# Patient Record
Sex: Female | Born: 1965 | Race: White | Hispanic: No | Smoking: Never smoker
Health system: Southern US, Community
[De-identification: ages and names within clinical notes are randomized; demographics above are authoritative.]

## PROBLEM LIST (undated history)

## (undated) DIAGNOSIS — F419 Anxiety disorder, unspecified: Secondary | ICD-10-CM

## (undated) DIAGNOSIS — T7840XA Allergy, unspecified, initial encounter: Secondary | ICD-10-CM

## (undated) DIAGNOSIS — M1990 Unspecified osteoarthritis, unspecified site: Secondary | ICD-10-CM

## (undated) DIAGNOSIS — F341 Dysthymic disorder: Secondary | ICD-10-CM

## (undated) DIAGNOSIS — F3189 Other bipolar disorder: Secondary | ICD-10-CM

## (undated) DIAGNOSIS — M129 Arthropathy, unspecified: Secondary | ICD-10-CM

## (undated) HISTORY — DX: Arthropathy, unspecified: M12.9

## (undated) HISTORY — PX: TUBAL LIGATION: SHX77

## (undated) HISTORY — DX: Other bipolar disorder: F31.89

## (undated) HISTORY — DX: Dysthymic disorder: F34.1

## (undated) HISTORY — DX: Anxiety disorder, unspecified: F41.9

## (undated) HISTORY — DX: Allergy, unspecified, initial encounter: T78.40XA

## (undated) HISTORY — DX: Unspecified osteoarthritis, unspecified site: M19.90

---

## 1998-10-09 ENCOUNTER — Other Ambulatory Visit: Admission: RE | Admit: 1998-10-09 | Discharge: 1998-10-09 | Payer: Self-pay | Admitting: Obstetrics and Gynecology

## 1999-12-31 ENCOUNTER — Other Ambulatory Visit: Admission: RE | Admit: 1999-12-31 | Discharge: 1999-12-31 | Payer: Self-pay | Admitting: Obstetrics and Gynecology

## 2000-07-16 ENCOUNTER — Inpatient Hospital Stay (HOSPITAL_COMMUNITY): Admission: AD | Admit: 2000-07-16 | Discharge: 2000-07-16 | Payer: Self-pay | Admitting: Obstetrics and Gynecology

## 2000-08-22 ENCOUNTER — Observation Stay (HOSPITAL_COMMUNITY): Admission: AD | Admit: 2000-08-22 | Discharge: 2000-08-23 | Payer: Self-pay | Admitting: Obstetrics and Gynecology

## 2000-09-02 ENCOUNTER — Encounter (INDEPENDENT_AMBULATORY_CARE_PROVIDER_SITE_OTHER): Payer: Self-pay

## 2000-09-02 ENCOUNTER — Inpatient Hospital Stay (HOSPITAL_COMMUNITY): Admission: AD | Admit: 2000-09-02 | Discharge: 2000-09-05 | Payer: Self-pay | Admitting: Obstetrics and Gynecology

## 2001-01-12 ENCOUNTER — Other Ambulatory Visit: Admission: RE | Admit: 2001-01-12 | Discharge: 2001-01-12 | Payer: Self-pay | Admitting: Obstetrics and Gynecology

## 2004-09-17 ENCOUNTER — Ambulatory Visit: Payer: Self-pay | Admitting: Family Medicine

## 2005-02-20 ENCOUNTER — Ambulatory Visit: Payer: Self-pay | Admitting: Family Medicine

## 2005-08-29 ENCOUNTER — Ambulatory Visit: Payer: Self-pay | Admitting: Internal Medicine

## 2006-09-08 ENCOUNTER — Ambulatory Visit: Payer: Self-pay | Admitting: Family Medicine

## 2007-03-10 ENCOUNTER — Telehealth: Payer: Self-pay | Admitting: Family Medicine

## 2007-05-07 ENCOUNTER — Telehealth: Payer: Self-pay

## 2007-05-28 ENCOUNTER — Ambulatory Visit: Payer: Self-pay | Admitting: Family Medicine

## 2007-06-17 ENCOUNTER — Telehealth: Payer: Self-pay | Admitting: Family Medicine

## 2007-08-27 ENCOUNTER — Ambulatory Visit: Payer: Self-pay | Admitting: Family Medicine

## 2007-08-27 LAB — CONVERTED CEMR LAB: Rapid Strep: NEGATIVE

## 2007-12-03 ENCOUNTER — Telehealth: Payer: Self-pay | Admitting: Family Medicine

## 2008-01-12 ENCOUNTER — Telehealth: Payer: Self-pay | Admitting: Family Medicine

## 2008-05-24 ENCOUNTER — Telehealth: Payer: Self-pay | Admitting: Family Medicine

## 2008-05-30 ENCOUNTER — Ambulatory Visit: Payer: Self-pay | Admitting: Family Medicine

## 2008-05-30 DIAGNOSIS — F341 Dysthymic disorder: Secondary | ICD-10-CM

## 2008-12-28 ENCOUNTER — Telehealth: Payer: Self-pay | Admitting: Family Medicine

## 2009-01-02 ENCOUNTER — Ambulatory Visit: Payer: Self-pay | Admitting: Family Medicine

## 2009-01-02 LAB — CONVERTED CEMR LAB
Nitrite: NEGATIVE
Urobilinogen, UA: 0.2
WBC Urine, dipstick: NEGATIVE

## 2009-01-03 ENCOUNTER — Encounter: Payer: Self-pay | Admitting: Family Medicine

## 2009-01-17 LAB — CONVERTED CEMR LAB
ALT: 18 units/L (ref 0–35)
Albumin: 3.9 g/dL (ref 3.5–5.2)
Basophils Relative: 0.8 % (ref 0.0–3.0)
CO2: 28 meq/L (ref 19–32)
Chloride: 107 meq/L (ref 96–112)
Cholesterol: 141 mg/dL (ref 0–200)
Direct LDL: 76.7 mg/dL
Eosinophils Absolute: 0.1 10*3/uL (ref 0.0–0.7)
Eosinophils Relative: 1.8 % (ref 0.0–5.0)
HCT: 35.3 % — ABNORMAL LOW (ref 36.0–46.0)
Hemoglobin: 12.7 g/dL (ref 12.0–15.0)
MCHC: 36 g/dL (ref 30.0–36.0)
MCV: 84.5 fL (ref 78.0–100.0)
Monocytes Absolute: 0.6 10*3/uL (ref 0.1–1.0)
Neutro Abs: 4.5 10*3/uL (ref 1.4–7.7)
Potassium: 4 meq/L (ref 3.5–5.1)
RBC: 4.17 M/uL (ref 3.87–5.11)
Sodium: 139 meq/L (ref 135–145)
Total CHOL/HDL Ratio: 4
Total Protein: 7.2 g/dL (ref 6.0–8.3)
Vit D, 25-Hydroxy: 27 ng/mL — ABNORMAL LOW (ref 30–89)
WBC: 7.8 10*3/uL (ref 4.5–10.5)

## 2009-11-15 ENCOUNTER — Telehealth: Payer: Self-pay | Admitting: Family Medicine

## 2010-01-10 ENCOUNTER — Ambulatory Visit: Payer: Self-pay | Admitting: Family Medicine

## 2010-01-10 DIAGNOSIS — F3189 Other bipolar disorder: Secondary | ICD-10-CM

## 2010-01-10 DIAGNOSIS — M1710 Unilateral primary osteoarthritis, unspecified knee: Secondary | ICD-10-CM

## 2010-01-10 DIAGNOSIS — M179 Osteoarthritis of knee, unspecified: Secondary | ICD-10-CM | POA: Insufficient documentation

## 2010-01-14 ENCOUNTER — Telehealth: Payer: Self-pay | Admitting: Family Medicine

## 2010-09-10 NOTE — Progress Notes (Signed)
Summary: Alprazolam refill request  Phone Note Call from Patient Call back at [phone]   Caller: Patient Call For: Judithann Sheen MD Summary of Call: VM from pt requesting refil of Alprazolam Initial call taken by: Sid Falcon LPN,  January 15, 5175 10:59 AM  Follow-up for Phone Call        called and left mess for pt to return call as she was seen last thurday and rx given to pt along with rx for diclofencc Follow-up by: Pura Spice, RN,  January 15, 2010 8:03 AM  Additional Follow-up for Phone Call Additional follow up Details #1::        pt called has rx alprazolam and had it filled   Additional Follow-up by: Pura Spice, RN,  January 15, 2010 10:18 AM

## 2010-09-10 NOTE — Progress Notes (Signed)
Summary: refill alprazolam needs ov   Phone Note From Pharmacy   [facility]: [facility]  [address] [place] [phone]* Reason for Call: Needs renewal Summary of Call: refill alprazolam  Initial call taken by: Pura Spice, RN,  November 15, 2009 1:19 PM  Follow-up for Phone Call        ok x 1  Follow-up by: Pura Spice, RN,  November 15, 2009 1:19 PM    New/Updated Medications: ALPRAZOLAM 0.5 MG  TABS (ALPRAZOLAM) three times a day  needs ov Prescriptions: ALPRAZOLAM 0.5 MG  TABS (ALPRAZOLAM) three times a day  needs ov  #90 x 0   Entered by:   Pura Spice, RN   Authorized by:   Judithann Sheen MD   Signed by:   Pura Spice, RN on 11/15/2009   Method used:   Telephoned to ...       [facility]  [address] [place] [phone]* (retail)       [phone]       [place], [place]  [address]       Ph: [phone] or [phone]       Fax: [phone]   RxID:   [id number]

## 2010-09-10 NOTE — Assessment & Plan Note (Signed)
Summary: REFILL MEDS/CB   Vital Signs:  Patient profile:   45 year old female Weight:      175 pounds BMI:     28.78 O2 Sat:      94 % Temp:     98.9 degrees F Pulse rate:   88 / minute BP sitting:   106 / 70  (left arm)  Vitals Entered By: Pura Spice, RN (January 10, 2010 4:29 PM) CC: reill alprazolam    History of Present Illness: This 45 year old white female is in to go over medications and refill her alprazolam. We discussed last visit for her to see a psychiatrist which he did and the diagnosis was made of bipolar disease. TSE and Dr. Tomasa Rand a psychiatrist, and sees a third cyst every 2 weeks by the name of carcinoma S[place] she is on Depakote and neck roll at bedtime she discussed with this contrast the fact that she had been taken alprazolam in May he agrees that she can continue this as needed Her only other complaints are that of joint discomfort of the knees with some swelling pain after sitting and began to walk and also to getting out of bed in the morning it persists but not quite as bad and she desires treatment  Allergies (verified): No Known Drug Allergies  Review of Systems      See HPI  The patient denies anorexia, fever, weight loss, weight gain, vision loss, decreased hearing, hoarseness, chest pain, syncope, dyspnea on exertion, peripheral edema, prolonged cough, headaches, hemoptysis, abdominal pain, melena, hematochezia, severe indigestion/heartburn, hematuria, incontinence, genital sores, muscle weakness, suspicious skin lesions, transient blindness, difficulty walking, depression, unusual weight change, abnormal bleeding, enlarged lymph nodes, angioedema, breast masses, and testicular masses.    Physical Exam  General:  Well-developed,well-nourished,in no acute distress; alert,appropriate and cooperative throughout examination Lungs:  Normal respiratory effort, chest expands symmetrically. Lungs are clear to auscultation, no crackles or wheezes. Heart:   Normal rate and regular rhythm. S1 and S2 normal without gallop, murmur, click, rub or other extra sounds. Msk:  tenderness medially and laterally aspect of both knees no swelling Extremities:  No clubbing, cyanosis, edema, or deformity noted with normal full range of motion of all joints.     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  Problem # 2:  BIPOLAR II DISORDER (ICD-296.89) 1500 mg Depakote lamictal hs  Complete Medication List: 1)  Alprazolam 0.5 Mg Tabs (Alprazolam) .... Three times a day 2)  Diclofenac Sodium 75 Mg Tbec (Diclofenac sodium) .Marland Kitchen.. 1 two times a day by mouth for arthritis 3)  Depakote 500 Mg Tbec (Divalproex sodium) .... 3 h.s. 4)  Lamictil  .... One h.s.  Patient Instructions: 1)  continue under the care of the psychiatrist and therapis continued 2)  Depakote in the medical 3)  Since his psychiatrist is agreeable continue alprazolam as needed 4)  With the new diagnosis of arthritis of the knees we will start  5)  Diclofenac 75 mg twice daily after meals 6)  Return in 6 months or earlier if needed Prescriptions: DICLOFENAC SODIUM 75 MG TBEC (DICLOFENAC SODIUM) 1 two times a day by mouth for arthritis  #60 x 11   Entered and Authorized by:   Judithann Sheen MD   Signed by:   Judithann Sheen MD on 01/10/2010   Method used:   [facility]  [facility]  [address] [place] [phone]* (retail)       [phone] [address]       [place], [place]  [address]       Ph: [phone] or [phone]       Fax: [phone]   RxID:   [id number] ALPRAZOLAM 0.5 MG  TABS (ALPRAZOLAM) three times a day  #90 x 5   Entered by:   Pura Spice, RN   Authorized by:   Judithann Sheen MD   Signed by:   Pura Spice, RN on 01/10/2010   Method used:   Print then Give to Patient   RxID:   [id number]

## 2010-09-11 ENCOUNTER — Encounter: Payer: Self-pay | Admitting: Internal Medicine

## 2010-09-11 ENCOUNTER — Ambulatory Visit (INDEPENDENT_AMBULATORY_CARE_PROVIDER_SITE_OTHER): Payer: BC Managed Care – PPO | Admitting: Internal Medicine

## 2010-09-11 ENCOUNTER — Encounter: Payer: Self-pay | Admitting: Family Medicine

## 2010-09-11 VITALS — BP 120/80 | HR 78 | Temp 98.6°F | Wt 186.0 lb

## 2010-09-11 DIAGNOSIS — B029 Zoster without complications: Secondary | ICD-10-CM

## 2010-09-11 MED ORDER — VALACYCLOVIR HCL 1 G PO TABS: 1000.0000 mg | ORAL_TABLET | Freq: Three times a day (TID) | ORAL | Status: AC

## 2010-09-11 NOTE — Progress Notes (Signed)
  Subjective:    Patient ID: Beverly Rowe, female    DOB: [birth year], 45 y.o.   MRN: [id number] Complaint  of 6 days of  itchy burn  rash at the base of her left neck. It has gotten somewhat bigger and she's developed a bitemporal headache and feeling tired without fever nausea or vomiting. She has now had some tingling down her left arm shoulder area but no extension of the rash. She has a remote history of natural chickenpox but no recent exposure. No bite no history of recurrent skin problems or rashes.  She's had no recent changes in her medications that are glued Lamictal and Depakote. She is followed by Dr. Tomasa Rand. HPI    Review of Systems  Constitutional: Positive for fatigue. Negative for fever, activity change and appetite change.  HENT: Positive for neck pain. Negative for ear pain, facial swelling and tinnitus.   Eyes: Negative for visual disturbance.  Neurological: Positive for numbness and headaches. Negative for dizziness, tremors, facial asymmetry and weakness.  Hematological: Negative for adenopathy.       Objective:   Physical Exam  Constitutional: She appears well-developed and well-nourished. No distress.  HENT:  Head: Normocephalic.  Eyes: Conjunctivae are normal. Pupils are equal, round, and reactive to light. Right eye exhibits no discharge.  Neck: Normal range of motion. No thyromegaly present.  Cardiovascular: Normal rate.   Pulmonary/Chest: Effort normal.  Lymphadenopathy:    She has no cervical adenopathy.  Neurological: She has normal strength. No cranial nerve deficit.  Skin: Skin is warm and dry. Rash (papular vesicular rash in about 3 x 4 "area left neck no blisters) noted. No petechiae noted. Rash is vesicular. She is not diaphoretic.     Psychiatric: She has a normal mood and affect. Her behavior is normal. Thought content normal.          Assessment & Plan:  See  Plan

## 2010-09-11 NOTE — Patient Instructions (Signed)
  Shingles (Herpes Zoster)   Shingles is caused by the same virus that causes chicken pox (varicella zoster virus or VZV). Shingles often occurs many years or decades after having chicken pox. That is why it is more common in adults older than 50 years. The virus reactivates and breaks out as an infection in a nerve root.   SYMPTOMS  The initial feeling (sensations) may be pain. This pain is usually described as: l Burning. l Stabbing. l Throbbing.   Tingling in the nerve root.  A red rash will follow in a couple days. The rash may occur in any area of the body and is usually on one side (unilateral) of the body in a band or belt-like pattern. The rash usually starts out as very small blisters (vesicles). They will dry up after 7 to 10 days. This is not usually a significant problem except for the pain it causes.  Long lasting (chronic) pain is more likely in an elderly person. It can last months to years. This condition is called post-herpetic neuralgia.   Shingles can be an extremely severe infection in someone with AIDS, a weakened immune system or with forms of leukemia. It can also be severe if you are taking transplant medications or other medications that weaken the immune system.   TREATMENT Your caregiver will often treat you with:  Antiviral drugs.  Anti-inflammatory drugs.  Pain medications.  Bed rest is very important in preventing the pain associated with herpes zoster (post-herpetic neuralgia).  Application of heat in the form of a hot-water bottle or electric heating pad or gentle pressure with the hand is recommended to help with the pain or discomfort.   HOME CARE INSTRUCTIONS  Cool compresses to the area of rash may be helpful.  Only take over-the-counter or prescription medicines for pain, discomfort or fever as directed by your caregiver.  Avoid contact with: l Babies. l Pregnant women. l Children with eczema. l Elderly people with transplants. l People  with chronic illnesses, such as leukemia and AIDS.  If the area involved is on your face, you may receive a referral for follow-up to a specialist. It is very important to keep all follow-up appointments. This will help avoid eye complications, chronic pain or disability.   SEEK IMMEDIATE MEDICAL CARE IF:  You develop any pain (headache) in the area of the face or eye. This must be followed carefully by your caregiver or ophthalmologist. An infection in part of your eye (cornea) can be very serious. It could lead to blindness.  You do not have pain relief from prescribed medications.  The redness or swelling spreads.  The area involved becomes very swollen and painful.  You have an oral temperature above 102 F (38 C), not controlled by medicine.  You notice any red or painful lines extending away from the affected area toward your heart (lymphangitis).  Your condition is worsening or has changed.   Document Released: 07/28/2005  Document Re-Released: 07/16/2009 [facility] Patient Information 2011 [place], [place]. take as discussed and use Ibuprofen 800  3 x per day or aleve 2  Twice a day.    Expect improvement within a week. Call of follow up if pain severity or on face or eye.

## 2010-09-12 ENCOUNTER — Telehealth: Payer: Self-pay | Admitting: *Deleted

## 2010-09-12 NOTE — Telephone Encounter (Signed)
No  Need to change activity.

## 2010-09-12 NOTE — Telephone Encounter (Signed)
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  Any reason to do anything differently.?

## 2010-09-13 NOTE — Telephone Encounter (Signed)
Pt. Notified.

## 2010-12-27 NOTE — H&P (Signed)
[facility]  Patient:    Beverly Rowe, Beverly Rowe                      MRN: [id number] Adm. Date:  [id number] Attending:  Leonard Schwartz Dictator:   Vance Gather Duplantis, C.N.M.                         History and Physical  HISTORY OF PRESENT ILLNESS:   Ms. Cieslewicz is a 45 year old, married white female, gravida 3, para 1-0-1-1, at 34-2/7 weeks who presents for evaluation complaining of a large ______  blood noted when she first got up this morning.  She says that she had noticed some menstrual cramping late last night and that when she got up this morning, she got all the way to the shower before she noted that she was bleeding but then noted a trail of blood from her bedroom to the bathroom.  She denies any cramping at this time or any further bleeding since 6 a.m.  PRENATAL COURSE:              Her pregnancy has been followed at [facility] by the CNM/MD Service and has been complicated by complete placenta previa, questionable LMP, and a history of irritable bowel syndrome.  OBSTETRIC/GYNECOLOGIC HISTORY:                      She is a gravida 3, para 1-0-1-1 who had a miscarriage in 1992 and a normal spontaneous vaginal delivery of a viable female infant in November of 1998 with no complication.  ALLERGIES:                    She has no known drug allergies.  PAST MEDICAL HISTORY:         She reports having had the usual childhood diseases.  She reports a history of irritable bowel syndrome, occasional urinary tract infection, history of occasional migraines, and her only surgery was her wisdom removed at age 12.  FAMILY HISTORY:               Significant for a maternal grandmother with varicosities.  Paternal grandmother with insulin-dependent diabetes.  GENETIC HISTORY:              Negative.  SOCIAL HISTORY:               She is married to Sharee Holster, who is involved and supportive.  She is employed part-time; he is employed  full-time. They deny any illicit drug use, alcohol or smoking with this pregnancy.  PRENATAL LABORATORY DATA:     Her blood type is 0 positive.  Her antibody screen is negative.  Syphilis is nonreactive.  Rubella is immune.  Hepatitis B surface antigen is negative.  HIV is nonreactive.  GC and Chlamydia are both negative.  Pap is within normal limits.  One-hour Glucola is within normal limits.  Maternal serum alpha fetoprotein was within normal range.  PHYSICAL EXAMINATION:  VITAL SIGNS:                  Her vital signs are stable.  She is afebrile.  HEENT:                        Grossly within normal limits.  HEART:  Regular rhythm and rate.  CHEST:                        Clear.  BREASTS:                      Soft and nontender.  ABDOMEN:                      Gravid with uterine contractions not noted. Fetal heart rate is reactive and reassuring.  No blood noted externally, and cervix exam was deferred secondary to previa.  EXTREMITIES:                  Within normal limits.  ASSESSMENT:                   1. Intrauterine pregnancy, at 34-2/7 weeks.                               2. Known placenta previa.                               3. Episode of bleeding.  PLAN:                         Per consult with Dr. Leonard Schwartz is to admit to labor and delivery, to give her continuous electronic fetal monitoring, IV saline lock, and bed rest with bathroom privileges. DD:  08/23/99 TD:  08/22/00 Job: [id number]

## 2010-12-27 NOTE — H&P (Signed)
[facility]  Patient:    KARIEL, SKILLMAN                      MRN: [id number] Adm. Date:  [id number] Disc. Date: [id number] Attending:  Leonard Schwartz Dictator:   Mack Guise, C.N.M.                         History and Physical  HISTORY:                      Ms. Schrier is a 45 year old, gravida 3, para 1-0-1-1 at 35-6/7 weeks, who presents with a known complete placenta previa, who is actively bleeding.  She reports no bleeding, cramping, contractions, or pain this a.m., but upon going to the bathroom at 10 a.m. she began bleeding and has continued bleeding since that time.  She is now having some cramping, but does not feel contractions although they are picking up on the monitor. She reports positive fetal movement throughout this whole episode.  Her pregnancy has been followed by the CNM M.D. service at ___________ and is remarkable for (1) Complete placenta previa.  (2) History IBS.  (3) Unknown LMP.  (4) Unknown group B strep status.  This patient was initially evaluated at the office of [facility] on February 27, 2001 at approximately 9 weeks.  EDD determined by early pregnancy ultrasonography and confirmed with follow up. Complete placenta previa was noticed at 19 weeks and has been followed and has remained unresolved.  The patient has been admitted several times for bleeding episodes and was sent home in stable condition until today when she presented actively bleeding.  History of irritable bowel syndrome.  OBSTETRIC HISTORY:            In 1992, FAB; 1998, normal spontaneous vaginal delivery with the birth of a 7 pound 9 ounce female infant, with no complications; and the present pregnancy.  MEDICAL HISTORY:              History of abnormal Pap smear with the repeat being normal.  The patient suffers from occasional migraines.  SOCIAL HISTORY:               Wisdom teeth, 45 years old.  FAMILY HISTORY:               Maternal grandmother  with a history of varicose veins.  Paternal grandmother with a history of diabetes.  GENETIC HISTORY:              There is no family history of familial or genetic disorders, children that died in infancy or that were born with birth defects.  SOCIAL HISTORY:               Ms. Fiala is a 45 year old Caucasian female. She is married to [employer].  He is involved and supportive.  They do not subscribe to a religious faith.  She denies the use of tobacco, alcohol, or illicit drugs.  ALLERGIES:                    The patient has no known allergies.  PRENATAL LABORATORY WORK:     On February 28, 2000, hemoglobin and hematocrit 12.7 and 37.3, platelets 260,000.  Blood type and Rh O positive.  Antibody screen negative.  VDRL nonreactive.  Rubella immune.  Hepatitis B surface antigen negative.  HIV nonreactive.  Pap smear within normal  limits.  GC and Chlamydia negative.  On April 07, 2000, AFP/free B to hCG within normal range.  On June 30, 2000, 1-hour glucose challenge 115 and hemoglobin 11.3.  REVIEW OF SYSTEMS:            The patient has an intrauterine pregnancy at 35-6/7 weeks, with a complete placenta previa actively bleeding, although her vital signs are stable at the present time and babys heart rate is reactive and reassuring.  OBJECTIVE:  VITAL SIGNS:                  Vital signs stable.  Afebrile.  LUNGS:                        Clear.  HEART:                        Regular rate and rhythm.  ABDOMEN:                      Gravid in its contour.  It is soft and nontender.  Leopolds maneuvers finds the infant to be in the longitudinal ___________ cephalic presentation, with an estimated fetal weight of 5-1/2 to 6 pounds.  PELVIC:                       Exam is deferred.  The patient is contracting approximately every 5 minutes.  ASSESSMENT:                   Intrauterine pregnancy at 35-6/7 weeks. Complete placenta previa, actively bleeding.  PLAN:                          Admit for primary low transverse cesarean delivery per Dr. Marline Backbone.  Dr. Stefano Gaul will be in to talk to patient. DD:  09/02/00 TD:  09/02/00 Job: [id number]

## 2010-12-27 NOTE — Discharge Summary (Signed)
[facility]  Patient:    Beverly Rowe, Beverly Rowe                      MRN: [id number] Adm. Date:  09/02/00 Disc. Date: 09/05/00 Attending:  Janine Limbo, M.D. Dictator:   Nigel Bridgeman, C.N.M.                           Discharge Summary  ADMITTING DIAGNOSES:          1. Intrauterine pregnancy at 36 weeks.                               2. Complete placenta previa, with bleeding.                               3. Desires sterilization.  DISCHARGE DIAGNOSES:          1. Intrauterine pregnancy at 36 weeks.                               2. Complete placenta previa, with bleeding.                               3. Desires stabilization.                               4. Neonatal intensive care unit infant.  PROCEDURES:                   1. Primary low transverse cesarean section.                               2. Bilateral tubal ligation.                               3. Spinal anesthesia.  HISTORY OF PRESENT ILLNESS:   Beverly Rowe is a 45 year old gravida 3, para 1-0-1-1 at 35-6/7ths weeks who presented to maternity admissions unit on September 02, 2000 with a known previa with active bleeding.  She had had one previous bleeding episode approximately two weeks earlier.  HOSPITAL COURSE:              On admission, fetal heart rate was reactive, contractions were approximately every five minutes, there was a moderate amount of bleeding involved.  Decision was made to proceed with cesarean section.  Patient was taken to the operating room for a primary low transverse cesarean section with a tubal ligation performed by Dr. Stefano Gaul under spinal anesthesia.  Findings were a viable female by the name of Beverly Rowe, weight 6 pounds 11 ounces, Apgars were 5 and 8.  There were normal uterus, tubes, and ovaries noted.  There was a complete previa noted.  Estimated blood loss was 750 cc.  Infant was initially taken to the full-term nursery but then was transferred to the NICU  secondary to respiratory issues.  By postop day #1, patient was overall doing well, she was requiring a PCA for pain but was converted to p.o. meds that day, her hemoglobin was 10.0 down from  11.5, her physical exam was within normal limits, she was bottle-feeding.  Through the rest of her hospital stay she was doing well.  Her incision remained clean, dry, and intact.  Infant was stable in the NICU.  By postop day #3, patient was up ad lib, her incision was clean, dry, and intact, her physical exam was within normal limits.  She was deemed to have received full benefit of her hospital stay and was discharged home.  Her staples were removed and Steri-Strips were applied.  DISCHARGE MEDICATIONS:           1. Motrin 600 mg p.o. q.6h. p.r.n. pain.                                  2. Tylox one to two p.o. q.3-4h. p.r.n. pain.                                  3. Prenatal vitamin one p.o. q.d.  DISCHARGE FOLLOWUP:              Six weeks at [facility]. DD: 09/05/00 TD:  09/05/00 Job: [id number]

## 2010-12-27 NOTE — Op Note (Signed)
[facility]  Patient:    Beverly Rowe, Beverly Rowe                      MRN: [id number] Proc. Date: 09/02/00 Adm. Date:  [id number] Disc. Date: [id number] Attending:  Leonard Schwartz                           Operative Report  PREOPERATIVE DIAGNOSES:       1. Thirty-six weeks gestation.                               2. Complete placenta previa.                               3. Third trimester bleeding.                               4. Desires sterilization.  POSTOPERATIVE DIAGNOSES:      1. Thirty-six weeks gestation.                               2. Complete placenta previa.                               3. Third trimester bleeding.                               4. Desires sterilization.  PROCEDURE:                    1. Primary low transverse cesarean section.                               2. Bilateral tubal ligation.  SURGEON:                      Janine Limbo, M.D.  ASSISTANT:                    Mack Guise, C.N.M.  ANESTHESIA:                   Spinal.  ESTIMATED BLOOD LOSS:         750 cc.  DISPOSITION:                  The patient is a 45 year old female, gravida 3, para 1-0-1-1, who presents at [redacted] weeks gestation. This pregnancy has been complicated by a complete placenta previa. The patient began having heavy vaginal bleeding on the day of her surgery. This continued. The decision was made to proceed with delivery. The patient also desires permanent sterilization. She understands the indications for her procedure and she accepts the risk of, but limited to, anesthetic complications, bleeding, infections, and possible damage to the surrounding organs.  FINDINGS:                     A 6-pound 11-ounce female infant Casimiro Needle) was delivered from a cephalic position. The Apgars were 5 at one minute and 8 at five minutes. The uterus,  fallopian tubes, and ovaries did appear normal. There was a complete placenta previa present. The  placenta was sent to pathology.  DESCRIPTION OF PROCEDURE:     The patient was taken to the operating room where a spinal anesthetic was given. The patients abdomen, perineum, and vagina were prepped with multiple layers of Betadine. A Foley catheter had been placed in the bladder. The patient was sterilely draped. A low transverse incision was made in the abdomen and carried sharply through the subcutaneous tissue, the fascia, and the anterior peritoneum. An incision was made in the lower uterine segment and extended transversely. The fetal head was delivered with the assistance of a Mityvac vacuum extractor. The mouth and nose were suctioned. The remainder of the infant was delivered. The cord was clamped and cut. The infant was handed to the awaiting pediatric team. Routine cord blood studies were obtained. The placenta was removed. The uterine cavity was cleaned of amniotic fluid, membranes, and tissue. The uterine incision was closed using a running locking suture of 2-0 Vicryl. Hemostasis was adequate. The left fallopian tube was identified and followed to its fimbriated end. A knuckle of tube was made on the left using a free tie and then a suture ligature of 0 plain catgut. The knuckle of tube thus made was excised. Hemostasis was adequate. An identical procedure was carried out on the opposite side. Again, hemostasis was adequate. All instruments were then removed. The peritoneal cavity was irrigated. The anterior peritoneum and the abdominal musculature were reapproximated in the midline using 2-0 Vicryl. The fascia was closed using a running suture of 0 Vicryl followed by three interrupted sutures of 0 Vicryl. The subcutaneous layer was closed using 2-0 Vicryl. The skin was reapproximated using skin staples. Sponge, needle, and instrument counts were correct on two occasions. The estimated blood loss was 750 cc. The patients tolerated her procedure well. The patient was taken  to the recovery room in stable condition. The infant was taken to the full-term nursery in stable condition. Portions of the fallopian tubes were sent to pathology  for evaluation. DD:  09/02/00 TD:  09/02/00 Job: [id number]

## 2010-12-27 NOTE — Discharge Summary (Signed)
[facility]  Patient:    Beverly Rowe, Beverly Rowe                      MRN: [id number] Adm. Date:  [id number] Disc. Date: [id number] Attending:  Leonard Schwartz Dictator:   Mack Guise, C.N.M.                           Discharge Summary  ADMITTING DIAGNOSES:          1. Intrauterine pregnancy at 34-2/7 weeks.                               2. Bleeding, with placenta previa.  DISCHARGE DIAGNOSES:          1. Intrauterine pregnancy at 34-2/7 weeks.                               2. Bleeding, with placenta previa.  HISTORY:                      In brief, [name] is a 45 year old, gravida 3, para 1-0-1-1 at 34-2/7 weeks, who presented for evaluation following a large gush of bleeding noted at home. She had no further bleeding when evaluated at the hospital but was admitted for 23-hour observation.  HOSPITAL COURSE:              Babys heart rate has remained reactive and reassuring throughout admission. There has been no further bleeding episodes and patient has had no more than two to three contractions per hour and no regular pattern and not perceived by the patient. She has not had any further contractions since midnight. Her hemoglobin on admission was 11.8, hematocrit 33.0, platelets 177,000. Patient is judged to be in satisfactory condition for discharge.  DISCHARGE INSTRUCTIONS:       Call for any signs and symptoms of preterm labor, any vaginal bleeding. Continue placenta previa precautions and bed rest.  DISCHARGE FOLLOWUP:           Schedule appointment to be seen at [facility] this week with MD. DD:  08/23/00 TD:  08/23/00 Job: [id number]

## 2011-03-06 ENCOUNTER — Ambulatory Visit (INDEPENDENT_AMBULATORY_CARE_PROVIDER_SITE_OTHER): Payer: BC Managed Care – PPO | Admitting: Family Medicine

## 2011-03-06 ENCOUNTER — Encounter: Payer: Self-pay | Admitting: Family Medicine

## 2011-03-06 VITALS — BP 110/70 | HR 60 | Temp 99.2°F | Wt 187.0 lb

## 2011-03-06 DIAGNOSIS — M129 Arthropathy, unspecified: Secondary | ICD-10-CM

## 2011-03-06 DIAGNOSIS — F319 Bipolar disorder, unspecified: Secondary | ICD-10-CM

## 2011-03-06 DIAGNOSIS — M1990 Unspecified osteoarthritis, unspecified site: Secondary | ICD-10-CM

## 2011-03-06 MED ORDER — ALPRAZOLAM 0.5 MG PO TABS: 0.5000 mg | ORAL_TABLET | Freq: Three times a day (TID) | ORAL | Status: DC | PRN

## 2011-03-06 MED ORDER — DICLOFENAC SODIUM 75 MG PO TBEC: 75.0000 mg | DELAYED_RELEASE_TABLET | Freq: Two times a day (BID) | ORAL | Status: DC

## 2011-03-06 NOTE — Patient Instructions (Signed)
Refilled medications Schedule physical examination and labs in near future, labs first then physical

## 2011-03-31 ENCOUNTER — Encounter: Payer: Self-pay | Admitting: Family Medicine

## 2011-03-31 ENCOUNTER — Other Ambulatory Visit (INDEPENDENT_AMBULATORY_CARE_PROVIDER_SITE_OTHER): Payer: BC Managed Care – PPO

## 2011-03-31 DIAGNOSIS — Z0000 Encounter for general adult medical examination without abnormal findings: Secondary | ICD-10-CM

## 2011-03-31 LAB — HEPATIC FUNCTION PANEL
ALT: 15 U/L (ref 0–35)
AST: 21 U/L (ref 0–37)
Alkaline Phosphatase: 38 U/L — ABNORMAL LOW (ref 39–117)
Bilirubin, Direct: 0 mg/dL (ref 0.0–0.3)
Total Bilirubin: 0.5 mg/dL (ref 0.3–1.2)

## 2011-03-31 LAB — BASIC METABOLIC PANEL
Chloride: 105 mEq/L (ref 96–112)
Creatinine, Ser: 0.9 mg/dL (ref 0.4–1.2)
GFR: 70.37 mL/min (ref >60.00)
Potassium: 3.5 mEq/L (ref 3.5–5.1)

## 2011-03-31 LAB — POCT URINALYSIS DIPSTICK
Blood, UA: NEGATIVE
Glucose, UA: NEGATIVE
Nitrite, UA: NEGATIVE
Spec Grav, UA: 1.03
pH, UA: 5.5

## 2011-03-31 LAB — CBC WITH DIFFERENTIAL/PLATELET
Basophils Relative: 0.9 % (ref 0.0–3.0)
Eosinophils Relative: 1.6 % (ref 0.0–5.0)
HCT: 36.8 % (ref 36.0–46.0)
MCV: 89.1 fl (ref 78.0–100.0)
Monocytes Absolute: 0.7 10*3/uL (ref 0.1–1.0)
Monocytes Relative: 11 % (ref 3.0–12.0)
Neutrophils Relative %: 50.1 % (ref 43.0–77.0)
Platelets: 209 10*3/uL (ref 150.0–400.0)
RBC: 4.13 Mil/uL (ref 3.87–5.11)
WBC: 6.7 10*3/uL (ref 4.5–10.5)

## 2011-03-31 LAB — TSH: TSH: 1.15 u[IU]/mL (ref 0.35–5.50)

## 2011-03-31 LAB — LIPASE: Lipase: 23 U/L (ref 11.0–59.0)

## 2011-03-31 NOTE — Progress Notes (Signed)
  Subjective:    Patient ID: Beverly Rowe, female    DOB: 1966/01/27, 45 y.o.   MRN: [id number] At this 45 year old white he female with bipolar disease as well as arthritis in knees is in to discuss her medical problems as well as refill her necessary medications there her medications consist of Depakote low medical and has been on diclofenac also alprazolam for panic or anxiety attack HPI    Review of Systems  Constitutional: Negative.   HENT: Negative.   Eyes: Negative.   Respiratory: Negative.   Cardiovascular: Negative.   Gastrointestinal: Negative.   Genitourinary: Negative.   Musculoskeletal: Positive for arthralgias.  Neurological: Negative.   Hematological: Negative.   Psychiatric/Behavioral:       Bipolar l with anxiety and depression       Objective:   Physical Exam patient is a well-built well-nourished white female in no distress Her lungs no acute problems Extremities knees or minimally swollen with tenderness medially and laterally bilaterally        Assessment & Plan:  Patient is under the care of a psychiatrist taking Depakote and will medical and to continue same Physician also aware that she takes alprazolam occasionally for acute anxiety or panic Arthritis of knees diclofenac 75 mg twice a day

## 2011-04-03 ENCOUNTER — Ambulatory Visit (INDEPENDENT_AMBULATORY_CARE_PROVIDER_SITE_OTHER): Payer: BC Managed Care – PPO | Admitting: Family Medicine

## 2011-04-03 ENCOUNTER — Encounter: Payer: Self-pay | Admitting: Family Medicine

## 2011-04-03 ENCOUNTER — Other Ambulatory Visit (HOSPITAL_COMMUNITY)
Admission: RE | Admit: 2011-04-03 | Discharge: 2011-04-03 | Disposition: A | Discharge status: Home / Self Care | Payer: BC Managed Care – PPO | Source: Ambulatory Visit | Attending: Family Medicine | Admitting: Family Medicine

## 2011-04-03 VITALS — BP 110/70 | HR 100 | Temp 98.1°F | Ht 64.5 in | Wt 184.0 lb

## 2011-04-03 DIAGNOSIS — F319 Bipolar disorder, unspecified: Secondary | ICD-10-CM

## 2011-04-03 DIAGNOSIS — R7309 Other abnormal glucose: Secondary | ICD-10-CM

## 2011-04-03 DIAGNOSIS — R739 Hyperglycemia, unspecified: Secondary | ICD-10-CM

## 2011-04-03 DIAGNOSIS — E669 Obesity, unspecified: Secondary | ICD-10-CM

## 2011-04-03 DIAGNOSIS — E6609 Other obesity due to excess calories: Secondary | ICD-10-CM

## 2011-04-03 DIAGNOSIS — E162 Hypoglycemia, unspecified: Secondary | ICD-10-CM

## 2011-04-03 DIAGNOSIS — Z0000 Encounter for general adult medical examination without abnormal findings: Secondary | ICD-10-CM

## 2011-04-03 DIAGNOSIS — Z01419 Encounter for gynecological examination (general) (routine) without abnormal findings: Secondary | ICD-10-CM | POA: Insufficient documentation

## 2011-04-03 NOTE — Progress Notes (Signed)
  Subjective:    Patient ID: Beverly Rowe, female    DOB: 1965-10-04, 45 y.o.   MRN: [id number] This 45 year old white married female is in for routine physical examination is great concern in that she has had hypoglycemic reactions over the past 4 months and are increasing in severity she has no symptoms of diabetes has a family history of diabetes she has had a problem with obesity has bipolar disorder and on medications as well as take  alprazolam 0.5mg  3 times a day when necessary for anxiety has not had a Pap smear in 10 years and I have discussed this with the patient we will do that today HPI    Review of Systems  Constitutional: Positive for unexpected weight change.  HENT: Negative.   Eyes: Negative.   Cardiovascular: Negative.   Gastrointestinal: Negative.   Genitourinary: Negative.   Musculoskeletal: Positive for arthralgias.  Neurological: Negative.   Hematological: Negative.   Psychiatric/Behavioral: The patient is nervous/anxious.        Bipolar disorder on Lamictal and Depakote       Objective:   Physical Exam patient is a well-developed well-nourished overweight white female who is in no distress HEENT here eyes  nose and throat negative a carotid pulses good thyroid nonpalpable Heart normal sinus regular rhythm no murmurs riffle pulses are good and equal bilaterally Restful no masses palpable nipples are erect and normal axilla clear no adenopathy Abdomen obese liver spleen kidneys are nonpalpable no masses normal bowel sounds Pelvic examination reveals normal external introital cervix is clear vaginal mucosa negative Pap smear done rectal examination reveals no abnormalities Extremities knees are normal less tender than on previous exam no other abnormalities Neurological exam no abnormalities noted Skin no nevi        Assessment & Plan:  Routine physical examination reveals a overweight female who has episodes of hypoglycemia and we have discussed  physiology how to treat hypoglycemia . Recommended weight loss program which she has started this past week. 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

## 2011-04-03 NOTE — Patient Instructions (Signed)
You have functional hypoglycemia which we discussed and there is no specific treatment to stop this but she must carry some type of food does peanut butter coke or juice. Be cautious when driving if you feel you're having an episode and pull over to the side and park treat yourself Since this is a hospital indicated that your to develop diabetes I recommend highly that you lose weight as we discussed Continue your medicines prescribed by the psychiatrist him continue diclofenac for arthritis your days continue alprazolam for anxiety and stress

## 2011-07-30 ENCOUNTER — Ambulatory Visit (INDEPENDENT_AMBULATORY_CARE_PROVIDER_SITE_OTHER): Payer: BC Managed Care – PPO | Admitting: Internal Medicine

## 2011-07-30 ENCOUNTER — Encounter: Payer: Self-pay | Admitting: Internal Medicine

## 2011-07-30 VITALS — BP 110/78 | HR 75 | Temp 98.3°F | Wt 162.0 lb

## 2011-07-30 DIAGNOSIS — F341 Dysthymic disorder: Secondary | ICD-10-CM

## 2011-07-30 DIAGNOSIS — F3189 Other bipolar disorder: Secondary | ICD-10-CM

## 2011-07-30 DIAGNOSIS — J069 Acute upper respiratory infection, unspecified: Secondary | ICD-10-CM

## 2011-07-30 MED ORDER — AZITHROMYCIN 250 MG PO TABS: ORAL_TABLET | ORAL | Status: AC

## 2011-07-30 NOTE — Patient Instructions (Signed)
It was good to see you today. We have reviewed your prior records including history and medications today Your symptoms are due to viral infection If you develop worsening symptoms or fever, we can reconsider antibiotics, but it does not appear necessary to use antibiotics at this time. Printed prescription for Zpak given to you today to fill if symptoms worse orlast >7 days Alternate between ibuprofen and tylenol for aches, pain and fever symptoms as discussed Hydrate and use OTC sinus congestion medication as you are doing

## 2011-07-30 NOTE — Assessment & Plan Note (Signed)
As above - follows with Tomasa Rand for same The current medical regimen is effective;  continue present plan and medications.

## 2011-07-30 NOTE — Assessment & Plan Note (Signed)
Follows with Cunningham q23mo for same - reports symptoms stable

## 2011-07-30 NOTE — Progress Notes (Signed)
  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 BIPOLAR II DISORDER   . ANXIETY DEPRESSION   . ARTHRITIS, KNEES, BILATERAL     Review of Systems Constitutional: No fever or night sweats, no unexpected weight change Pulmonary: No pleurisy or hemoptysis Cardiovascular: No chest pain or palpitations     Objective:   Physical Exam BP 110/78  Pulse 75  Temp(Src) 98.3 F (36.8 C) (Oral)  Wt 162 lb (73.483 kg)  SpO2 99% GEN: mildly ill appearing and audible head congestion HENT: NCAT, mild L maxillary sinus tenderness, nares with clear discharge, oropharynx mild erythema, no exudate Eyes: Vision grossly intact, no conjunctivitis Lungs: Clear to auscultation without rhonchi or wheeze, no increased work of breathing Cardiovascular: Regular rate and rhythm, no bilateral edema  Lab Results  Component Value Date   WBC 6.7 03/31/2011   HGB 12.5 03/31/2011   HCT 36.8 03/31/2011   PLT 209.0 03/31/2011   GLUCOSE 85 03/31/2011   CHOL 141 01/02/2009   TRIG 245.0* 01/02/2009   HDL 35.10* 01/02/2009   LDLDIRECT 76.7 01/02/2009   ALT 15 03/31/2011   AST 21 03/31/2011   NA 139 03/31/2011   K 3.5 03/31/2011   CL 105 03/31/2011   CREATININE 0.9 03/31/2011   BUN 11 03/31/2011   CO2 23 03/31/2011   TSH 1.15 03/31/2011   HGBA1C 5.4 04/03/2011       Assessment & Plan:  Viral URI  Sinusitis/conjunctivitis - related to above  Explained lack of efficacy for antibiotics in viral disease Written rx for empiric antibiotics prescribed to fill if symptom duration greater than 7 days Continue sinus and decongestion medication as ongoing Symptomatic care with Tylenol or Advil, hydration  and rest -  salt gargle advised as needed

## 2015-04-19 ENCOUNTER — Other Ambulatory Visit: Payer: Self-pay | Admitting: Obstetrics and Gynecology

## 2015-04-20 LAB — CYTOLOGY - PAP

## 2015-05-08 ENCOUNTER — Ambulatory Visit (INDEPENDENT_AMBULATORY_CARE_PROVIDER_SITE_OTHER): Payer: BC Managed Care – PPO | Admitting: Emergency Medicine

## 2015-05-08 VITALS — BP 128/78 | HR 94 | Temp 98.0°F | Resp 16 | Ht 65.0 in | Wt 178.0 lb

## 2015-05-08 DIAGNOSIS — J209 Acute bronchitis, unspecified: Secondary | ICD-10-CM

## 2015-05-08 DIAGNOSIS — J0140 Acute pansinusitis, unspecified: Secondary | ICD-10-CM

## 2015-05-08 MED ORDER — AMOXICILLIN-POT CLAVULANATE 875-125 MG PO TABS: 1.0000 | ORAL_TABLET | Freq: Two times a day (BID) | ORAL | Status: DC

## 2015-05-08 MED ORDER — PSEUDOEPHEDRINE-GUAIFENESIN ER 60-600 MG PO TB12: 1.0000 | ORAL_TABLET | Freq: Two times a day (BID) | ORAL | Status: DC

## 2015-05-08 MED ORDER — HYDROCOD POLST-CPM POLST ER 10-8 MG/5ML PO SUER: 5.0000 mL | Freq: Two times a day (BID) | ORAL | Status: DC

## 2015-05-08 NOTE — Patient Instructions (Signed)

## 2015-05-08 NOTE — Progress Notes (Signed)
Subjective:  Patient ID: Beverly Rowe, female    DOB: [birth year]  Age: 49 y.o. MRN: [id number]  CC: Nasal Congestion; Dizziness; Eye Problem; Shortness of Breath; Sore Throat; and Ear Fullness   HPI Beverly Rowe presents  with nasal congestion nasal discharge and purulent postnasal drip. She has a cough productive of some purulent sputum. She has no wheezing. She does have some exertional shortness of breath it's more of feeling than actual fact. She has no fever or chills. She has no nausea vomiting. She has no stool change. She does have a sore throat and pressure in ears. No improvement with over-the-counter medication  History Beverly Rowe has a past medical history of BIPOLAR II DISORDER; ANXIETY DEPRESSION; ARTHRITIS, KNEES, BILATERAL; Allergy; Anxiety; and Arthritis.   She has past surgical history that includes Cesarean section and Tubal ligation.   Her  family history includes Diabetes in her father and paternal grandmother; Heart disease in her paternal grandfather; Mental illness in her father and paternal grandmother; Stroke in her paternal grandmother.  She   reports that she has never smoked. She has never used smokeless tobacco. She reports that she drinks about 0.5 oz of alcohol per week. She reports that she does not use illicit drugs.  Outpatient Prescriptions Prior to Visit  Medication Sig Dispense Refill  . lamoTRIgine (LAMICTAL) 100 MG tablet Take 150 mg by mouth 2 (two) times daily.     Marland Kitchen ALPRAZolam (XANAX) 0.5 MG tablet Take 1 tablet (0.5 mg total) by mouth 3 (three) times daily as needed for anxiety. 90 tablet 5  . diclofenac (VOLTAREN) 75 MG EC tablet Take 1 tablet (75 mg total) by mouth 2 (two) times daily. 90 tablet 11  . divalproex (DEPAKOTE) 500 MG EC tablet 3 by mouth at bedtime      No facility-administered medications prior to visit.    Social History   Social History  . Marital Status: Single    Spouse Name: N/A  . Number of Children: N/A  .  Years of Education: N/A   Social History Main Topics  . Smoking status: Never Smoker   . Smokeless tobacco: Never Used  . Alcohol Use: 0.5 oz/week    1 drink(s) per week  . Drug Use: No  . Sexual Activity: Not Asked   Other Topics Concern  . None   Social History Narrative     Review of Systems  Constitutional: Negative for fever, chills and appetite change.  HENT: Positive for congestion, postnasal drip, rhinorrhea, sinus pressure and sore throat. Negative for ear pain.   Eyes: Negative for pain and redness.  Respiratory: Positive for cough. Negative for shortness of breath and wheezing.   Cardiovascular: Negative for leg swelling.  Gastrointestinal: Negative for nausea, vomiting, abdominal pain, diarrhea, constipation and blood in stool.  Endocrine: Negative for polyuria.  Genitourinary: Negative for dysuria, urgency, frequency and flank pain.  Musculoskeletal: Negative for gait problem.  Skin: Negative for rash.  Neurological: Negative for weakness and headaches.  Psychiatric/Behavioral: Negative for confusion and decreased concentration. The patient is not nervous/anxious.     Objective:  BP 128/78 mmHg  Pulse 94  Temp(Src) 98 F (36.7 C) (Oral)  Resp 16  Ht  (1.651 m)  Wt 178 lb (80.74 kg)  BMI 29.62 kg/m2  SpO2 98%  LMP 05/02/2015  Physical Exam  Constitutional: She is oriented to person, place, and time. She appears well-developed and well-nourished. No distress.  HENT:  Head: Normocephalic and atraumatic.  Right Ear: External ear normal.  Left Ear: External ear normal.  Nose: Nose normal.  Eyes: Conjunctivae and EOM are normal. Pupils are equal, round, and reactive to light. No scleral icterus.  Neck: Normal range of motion. Neck supple. No tracheal deviation present.  Cardiovascular: Normal rate, regular rhythm and normal heart sounds.   Pulmonary/Chest: Effort normal. No respiratory distress. She has no wheezes. She has no rales.  Abdominal: She  exhibits no mass. There is no tenderness. There is no rebound and no guarding.  Musculoskeletal: She exhibits no edema.  Lymphadenopathy:    She has no cervical adenopathy.  Neurological: She is alert and oriented to person, place, and time. Coordination normal.  Skin: Skin is warm and dry. No rash noted.  Psychiatric: She has a normal mood and affect. Her behavior is normal.      Assessment & Plan:   Beverly Rowe was seen today for nasal congestion, dizziness, eye problem, shortness of breath, sore throat and ear fullness.  Diagnoses and all orders for this visit:  Acute bronchitis, unspecified organism  Acute pansinusitis, recurrence not specified  Other orders -     amoxicillin-clavulanate (AUGMENTIN) 875-125 MG tablet; Take 1 tablet by mouth 2 (two) times daily. -     pseudoephedrine-guaifenesin (MUCINEX D) 60-600 MG 12 hr tablet; Take 1 tablet by mouth every 12 (twelve) hours. -     chlorpheniramine-HYDROcodone (TUSSIONEX PENNKINETIC ER) 10-8 MG/5ML SUER; Take 5 mLs by mouth 2 (two) times daily.  I am having Beverly Rowe start on amoxicillin-clavulanate, pseudoephedrine-guaifenesin, and chlorpheniramine-HYDROcodone. I am also having her maintain her divalproex, lamoTRIgine, ALPRAZolam, diclofenac, and QUEtiapine.  Meds ordered this encounter  Medications  . QUEtiapine (SEROQUEL) 300 MG tablet    Sig: Take 300 mg by mouth at bedtime.  Marland Kitchen amoxicillin-clavulanate (AUGMENTIN) 875-125 MG tablet    Sig: Take 1 tablet by mouth 2 (two) times daily.    Dispense:  20 tablet    Refill:  0  . pseudoephedrine-guaifenesin (MUCINEX D) 60-600 MG 12 hr tablet    Sig: Take 1 tablet by mouth every 12 (twelve) hours.    Dispense:  18 tablet    Refill:  0  . chlorpheniramine-HYDROcodone (TUSSIONEX PENNKINETIC ER) 10-8 MG/5ML SUER    Sig: Take 5 mLs by mouth 2 (two) times daily.    Dispense:  60 mL    Refill:  0    Appropriate red flag conditions were discussed with the patient as well as  actions that should be taken.  Patient expressed his understanding.  Follow-up: Return if symptoms worsen or fail to improve.  Carmelina Dane, MD

## 2015-08-09 ENCOUNTER — Telehealth: Payer: Self-pay

## 2015-08-09 NOTE — Telephone Encounter (Signed)
Patient wants to think about getting flu vaccine, will call back to schedule nurse visit if she decides to, patient has also made appt with dr burns on jan 30th to get established with new PCP

## 2015-09-10 ENCOUNTER — Other Ambulatory Visit (INDEPENDENT_AMBULATORY_CARE_PROVIDER_SITE_OTHER): Payer: BC Managed Care – PPO

## 2015-09-10 ENCOUNTER — Encounter: Payer: Self-pay | Admitting: Emergency Medicine

## 2015-09-10 ENCOUNTER — Encounter: Payer: Self-pay | Admitting: Internal Medicine

## 2015-09-10 ENCOUNTER — Ambulatory Visit (INDEPENDENT_AMBULATORY_CARE_PROVIDER_SITE_OTHER): Payer: BC Managed Care – PPO | Admitting: Internal Medicine

## 2015-09-10 VITALS — BP 102/82 | HR 87 | Temp 98.4°F | Resp 18 | Ht 64.0 in | Wt 176.0 lb

## 2015-09-10 DIAGNOSIS — Z0000 Encounter for general adult medical examination without abnormal findings: Secondary | ICD-10-CM

## 2015-09-10 DIAGNOSIS — K219 Gastro-esophageal reflux disease without esophagitis: Secondary | ICD-10-CM | POA: Diagnosis not present

## 2015-09-10 DIAGNOSIS — F341 Dysthymic disorder: Secondary | ICD-10-CM

## 2015-09-10 DIAGNOSIS — E669 Obesity, unspecified: Secondary | ICD-10-CM | POA: Insufficient documentation

## 2015-09-10 DIAGNOSIS — Z23 Encounter for immunization: Secondary | ICD-10-CM

## 2015-09-10 DIAGNOSIS — F3189 Other bipolar disorder: Secondary | ICD-10-CM | POA: Diagnosis not present

## 2015-09-10 LAB — LIPID PANEL
CHOL/HDL RATIO: 4
Cholesterol: 155 mg/dL (ref 0–200)
HDL: 42.6 mg/dL (ref >39.00)
LDL CALC: 75 mg/dL (ref 0–99)
NONHDL: 112.83
TRIGLYCERIDES: 188 mg/dL — AB (ref 0.0–149.0)
VLDL: 37.6 mg/dL (ref 0.0–40.0)

## 2015-09-10 LAB — CBC WITH DIFFERENTIAL/PLATELET
BASOS ABS: 0.1 10*3/uL (ref 0.0–0.1)
Basophils Relative: 1.1 % (ref 0.0–3.0)
EOS ABS: 0.2 10*3/uL (ref 0.0–0.7)
Eosinophils Relative: 2.6 % (ref 0.0–5.0)
HCT: 39 % (ref 36.0–46.0)
Hemoglobin: 13.2 g/dL (ref 12.0–15.0)
LYMPHS ABS: 2.1 10*3/uL (ref 0.7–4.0)
Lymphocytes Relative: 25.6 % (ref 12.0–46.0)
MCHC: 33.8 g/dL (ref 30.0–36.0)
MCV: 83.7 fl (ref 78.0–100.0)
Monocytes Absolute: 0.5 10*3/uL (ref 0.1–1.0)
Monocytes Relative: 6.1 % (ref 3.0–12.0)
NEUTROS ABS: 5.2 10*3/uL (ref 1.4–7.7)
NEUTROS PCT: 64.6 % (ref 43.0–77.0)
PLATELETS: 255 10*3/uL (ref 150.0–400.0)
RBC: 4.66 Mil/uL (ref 3.87–5.11)
RDW: 13.1 % (ref 11.5–15.5)
WBC: 8.1 10*3/uL (ref 4.0–10.5)

## 2015-09-10 LAB — COMPREHENSIVE METABOLIC PANEL
ALT: 12 U/L (ref 0–35)
AST: 14 U/L (ref 0–37)
Albumin: 4.1 g/dL (ref 3.5–5.2)
Alkaline Phosphatase: 53 U/L (ref 39–117)
BILIRUBIN TOTAL: 0.3 mg/dL (ref 0.2–1.2)
BUN: 12 mg/dL (ref 6–23)
CO2: 27 meq/L (ref 19–32)
CREATININE: 0.89 mg/dL (ref 0.40–1.20)
Calcium: 9 mg/dL (ref 8.4–10.5)
Chloride: 103 mEq/L (ref 96–112)
GFR: 71.71 mL/min (ref >60.00)
Glucose, Bld: 87 mg/dL (ref 70–99)
Potassium: 4 mEq/L (ref 3.5–5.1)
Sodium: 137 mEq/L (ref 135–145)
TOTAL PROTEIN: 7.4 g/dL (ref 6.0–8.3)

## 2015-09-10 LAB — TSH: TSH: 1.8 u[IU]/mL (ref 0.35–4.50)

## 2015-09-10 LAB — HIV ANTIBODY (ROUTINE TESTING W REFLEX): HIV: NONREACTIVE

## 2015-09-10 LAB — HEMOGLOBIN A1C: Hgb A1c MFr Bld: 5.5 % (ref 4.6–6.5)

## 2015-09-10 MED ORDER — GLUCOSAMINE-CHONDROITIN 500-400 MG PO TABS: 1.0000 | ORAL_TABLET | Freq: Three times a day (TID) | ORAL | Status: AC

## 2015-09-10 MED ORDER — RANITIDINE HCL 150 MG PO TABS: 150.0000 mg | ORAL_TABLET | Freq: Every day | ORAL | Status: DC

## 2015-09-10 MED ORDER — OMEPRAZOLE 20 MG PO CPDR: 20.0000 mg | DELAYED_RELEASE_CAPSULE | Freq: Every day | ORAL | Status: DC

## 2015-09-10 NOTE — Assessment & Plan Note (Signed)
Not currently controlled Discussed potential long-term consequences of uncontrolled GERD-specifically esophageal cancer Continue Zantac 150 milligrams nightly Start omeprazole 20 mg 30 minutes before breakfast Review GERD diet and lifestyle-encouraged weight loss Discussed the importance of lifestyle changes to help control her GERD Discussed that we do not want her on the omeprazole long-term and lifestyle changes are essential Stressed that she needs to follow-up if her heartburn is not controlled

## 2015-09-10 NOTE — Assessment & Plan Note (Signed)
Stable and controlled per patient Managed by psychiatry

## 2015-09-10 NOTE — Progress Notes (Signed)
Subjective:    Patient ID: Beverly Rowe, female    DOB: Jan 08, 1966, 50 y.o.   MRN: [id number]  HPI She is here to establish with a new pcp.  She is here for a physical.   GERD:  She has gerd daily.  She is currently taking 2 Zantac 150 mg tablets daily. She still has GERD on a daily basis.  Bipolar disorder, anxiety, depression: She is following with psychiatry and her psychiatric medications are prescribed by them. She currently feels that her mood is stable.  She does not have any questions or concerns.  Medications and allergies reviewed with patient and updated if appropriate.  Patient Active Problem List   Diagnosis Date Noted  . GERD (gastroesophageal reflux disease) 09/10/2015  . BIPOLAR II DISORDER 01/10/2010  . Arthritis of knee, degenerative 01/10/2010  . ANXIETY DEPRESSION 05/30/2008    Current Outpatient Prescriptions on File Prior to Visit  Medication Sig Dispense Refill  . QUEtiapine (SEROQUEL) 300 MG tablet Take 300 mg by mouth at bedtime.     No current facility-administered medications on file prior to visit.    Past Medical History  Diagnosis Date  . BIPOLAR II DISORDER   . ANXIETY DEPRESSION   . ARTHRITIS, KNEES, BILATERAL   . Allergy   . Anxiety   . Arthritis     Past Surgical History  Procedure Laterality Date  . Cesarean section    . Tubal ligation      Social History   Social History  . Marital Status: Single    Spouse Name: N/A  . Number of Children: N/A  . Years of Education: N/A   Social History Main Topics  . Smoking status: Never Smoker   . Smokeless tobacco: Never Used  . Alcohol Use: 0.5 oz/week    1 Standard drinks or equivalent per week     Comment: occasional  . Drug Use: No  . Sexual Activity: Not Asked   Other Topics Concern  . None   Social History Narrative   No regular exercise    Family History  Problem Relation Age of Onset  . Diabetes Father   . Mental illness Father   . Diabetes Paternal  Grandmother   . Stroke Paternal Grandmother   . Mental illness Paternal Grandmother   . Heart disease Paternal Grandfather     Review of Systems  Constitutional: Negative for fever, chills, appetite change and unexpected weight change.  HENT: Negative for congestion, hearing loss, sinus pressure and sore throat.   Eyes: Negative for visual disturbance.  Respiratory: Negative for cough, shortness of breath and wheezing.   Cardiovascular: Negative for chest pain, palpitations and leg swelling.  Gastrointestinal: Negative for nausea, abdominal pain, diarrhea, constipation and blood in stool.       GERD daily  Genitourinary: Negative for dysuria and hematuria.  Musculoskeletal: Positive for arthralgias (b/l knees only). Negative for myalgias and back pain.  Skin: Negative for rash.       No changes moles  Neurological: Negative for dizziness, weakness, light-headedness, numbness and headaches.       Objective:   Filed Vitals:   09/10/15 1049  BP: 102/82  Pulse: 87  Temp: 98.4 F (36.9 C)  Resp: 18   Filed Weights   09/10/15 1049  Weight: 176 lb (79.833 kg)   Body mass index is 30.2 kg/(m^2).   Physical Exam Constitutional: She appears well-developed and well-nourished. No distress.  HENT:  Head: Normocephalic and atraumatic.  Right  Ear: External ear normal. Normal ear canal and TM Left Ear: External ear normal.  Normal ear canal and TM Mouth/Throat: Oropharynx is clear and moist.  Normal bilateral ear canals and tympanic membranes  Eyes: Conjunctivae and EOM are normal.  Neck: Neck supple. No tracheal deviation present. No thyromegaly present.  No carotid bruit  Cardiovascular: Normal rate, regular rhythm and normal heart sounds.   No murmur heard.  No edema. Pulmonary/Chest: Effort normal and breath sounds normal. No respiratory distress. She has no wheezes. She has no rales.  Breast: deferred to Gyn Abdominal: Soft. She exhibits no distension. There is no  tenderness.  Lymphadenopathy: She has no cervical adenopathy.  Skin: Skin is warm and dry. She is not diaphoretic.  Psychiatric: She has a normal mood and affect. Her behavior is normal.          Assessment & Plan:   Physical exam: Screening blood work ordered Immunizations tetanus today Mammogram up to date Gyn up to date Exercise discussed the importance of regular exercise Danton Clap is overweight and I discussed that this does increase her risk of several medical problems. Discussed importance of regular exercise and decreasing portions along with healthy diet Skin normal. She does have 1 mole on her left forearm that she was concerned about, but it appears normal. I advised her to monitor. And if she sees changes she should see dermatology Substance abuse-no evidence of substance abuse  See Problem List for Assessment and Plan of chronic medical problems.

## 2015-09-10 NOTE — Assessment & Plan Note (Signed)
Managed by psychiatry 

## 2015-09-10 NOTE — Progress Notes (Signed)
Pre visit review using our clinic review tool, if applicable. No additional management support is needed unless otherwise documented below in the visit note. 

## 2015-09-10 NOTE — Patient Instructions (Addendum)
We have reviewed your prior records including labs and tests today.  Test(s) ordered today. Your results will be released to [place] (or called to you) after review, usually within 72hours after test completion. If any changes need to be made, you will be notified at that same time.  All other Health Maintenance issues reviewed.   All recommended immunizations and age-appropriate screenings are up-to-date.  Tetanus vaccine administered today.   Medications reviewed and updated.  Changes include adding omeprazole for your heartburn.     Your prescription(s) have been submitted to your pharmacy. Please take as directed and contact our office if you believe you are having problem(s) with the medication(s).   Gastroesophageal Reflux Disease, Adult Normally, food travels down the esophagus and stays in the stomach to be digested. However, when a person has gastroesophageal reflux disease (GERD), food and stomach acid move back up into the esophagus. When this happens, the esophagus becomes sore and inflamed. Over time, GERD can create small holes (ulcers) in the lining of the esophagus.  CAUSES This condition is caused by a problem with the muscle between the esophagus and the stomach (lower esophageal sphincter, or LES). Normally, the LES muscle closes after food passes through the esophagus to the stomach. When the LES is weakened or abnormal, it does not close properly, and that allows food and stomach acid to go back up into the esophagus. The LES can be weakened by certain dietary substances, medicines, and medical conditions, including:  Tobacco use.  Pregnancy.  Having a hiatal hernia.  Heavy alcohol use.  Certain foods and beverages, such as coffee, chocolate, onions, and peppermint. RISK FACTORS This condition is more likely to develop in:  People who have an increased body weight.  People who have connective tissue disorders.  People who use NSAID  medicines. SYMPTOMS Symptoms of this condition include:  Heartburn.  Difficult or painful swallowing.  The feeling of having a lump in the throat.  Abitter taste in the mouth.  Bad breath.  Having a large amount of saliva.  Having an upset or bloated stomach.  Belching.  Chest pain.  Shortness of breath or wheezing.  Ongoing (chronic) cough or a night-time cough.  Wearing away of tooth enamel.  Weight loss. Different conditions can cause chest pain. Make sure to see your health care provider if you experience chest pain. DIAGNOSIS Your health care provider will take a medical history and perform a physical exam. To determine if you have mild or severe GERD, your health care provider may also monitor how you respond to treatment. You may also have other tests, including:  An endoscopy toexamine your stomach and esophagus with a small camera.  A test thatmeasures the acidity level in your esophagus.  A test thatmeasures how much pressure is on your esophagus.  A barium swallow or modified barium swallow to show the shape, size, and functioning of your esophagus. TREATMENT The goal of treatment is to help relieve your symptoms and to prevent complications. Treatment for this condition may vary depending on how severe your symptoms are. Your health care provider may recommend:  Changes to your diet.  Medicine.  Surgery. HOME CARE INSTRUCTIONS Diet  Follow a diet as recommended by your health care provider. This may involve avoiding foods and drinks such as:  Coffee and tea (with or without caffeine).  Drinks that containalcohol.  Energy drinks and sports drinks.  Carbonated drinks or sodas.  Chocolate and cocoa.  Peppermint and mint flavorings.  Garlic  and onions.  Horseradish.  Spicy and acidic foods, including peppers, chili powder, curry powder, vinegar, hot sauces, and barbecue sauce.  Citrus fruit juices and citrus fruits, such as oranges,  lemons, and limes.  Tomato-based foods, such as red sauce, chili, salsa, and pizza with red sauce.  Fried and fatty foods, such as donuts, french fries, potato chips, and high-fat dressings.  High-fat meats, such as hot dogs and fatty cuts of red and white meats, such as rib eye steak, sausage, ham, and bacon.  High-fat dairy items, such as whole milk, butter, and cream cheese.  Eat small, frequent meals instead of large meals.  Avoid drinking large amounts of liquid with your meals.  Avoid eating meals during the 2-3 hours before bedtime.  Avoid lying down right after you eat.  Do not exercise right after you eat. General Instructions  Pay attention to any changes in your symptoms.  Take over-the-counter and prescription medicines only as told by your health care provider. Do not take aspirin, ibuprofen, or other NSAIDs unless your health care provider told you to do so.  Do not use any tobacco products, including cigarettes, chewing tobacco, and e-cigarettes. If you need help quitting, ask your health care provider.  Wear loose-fitting clothing. Do not wear anything tight around your waist that causes pressure on your abdomen.  Raise (elevate) the head of your bed 6 inches (15cm).  Try to reduce your stress, such as with yoga or meditation. If you need help reducing stress, ask your health care provider.  If you are overweight, reduce your weight to an amount that is healthy for you. Ask your health care provider for guidance about a safe weight loss goal.  Keep all follow-up visits as told by your health care provider. This is important. SEEK MEDICAL CARE IF:  You have new symptoms.  You have unexplained weight loss.  You have difficulty swallowing, or it hurts to swallow.  You have wheezing or a persistent cough.  Your symptoms do not improve with treatment.  You have a hoarse voice. SEEK IMMEDIATE MEDICAL CARE IF:  You have pain in your arms, neck, jaw,  teeth, or back.  You feel sweaty, dizzy, or light-headed.  You have chest pain or shortness of breath.  You vomit and your vomit looks like blood or coffee grounds.  You faint.  Your stool is bloody or black.  You cannot swallow, drink, or eat.   This information is not intended to replace advice given to you by your health care provider. Make sure you discuss any questions you have with your health care provider.   Document Released: 05/07/2005 Document Revised: 04/18/2015 Document Reviewed: 11/22/2014 Elsevier Interactive Patient Education 2016 [place] Maintenance, Female Adopting a healthy lifestyle and getting preventive care can go a long way to promote health and wellness. Talk with your health care provider about what schedule of regular examinations is right for you. This is a good chance for you to check in with your provider about disease prevention and staying healthy. In between checkups, there are plenty of things you can do on your own. Experts have done a lot of research about which lifestyle changes and preventive measures are most likely to keep you healthy. Ask your health care provider for more information. WEIGHT AND DIET  Eat a healthy diet  Be sure to include plenty of vegetables, fruits, low-fat dairy products, and lean protein.  Do not eat a lot of foods high  in solid fats, added sugars, or salt.  Get regular exercise. This is one of the most important things you can do for your health.  Most adults should exercise for at least 150 minutes each week. The exercise should increase your heart rate and make you sweat (moderate-intensity exercise).  Most adults should also do strengthening exercises at least twice a week. This is in addition to the moderate-intensity exercise.  Maintain a healthy weight  Body mass index (BMI) is a measurement that can be used to identify possible weight problems. It estimates body fat based on height and  weight. Your health care provider can help determine your BMI and help you achieve or maintain a healthy weight.  For females 23 years of age and older:   A BMI below 18.5 is considered underweight.  A BMI of 18.5 to 24.9 is normal.  A BMI of 25 to 29.9 is considered overweight.  A BMI of 30 and above is considered obese.  Watch levels of cholesterol and blood lipids  You should start having your blood tested for lipids and cholesterol at 49 years of age, then have this test every 5 years.  You may need to have your cholesterol levels checked more often if:  Your lipid or cholesterol levels are high.  You are older than 49 years of age.  You are at high risk for heart disease.  CANCER SCREENING   Lung Cancer  Lung cancer screening is recommended for adults 73-16 years old who are at high risk for lung cancer because of a history of smoking.  A yearly low-dose CT scan of the lungs is recommended for people who:  Currently smoke.  Have quit within the past 15 years.  Have at least a 30-pack-year history of smoking. A pack year is smoking an average of one pack of cigarettes a day for 1 year.  Yearly screening should continue until it has been 15 years since you quit.  Yearly screening should stop if you develop a health problem that would prevent you from having lung cancer treatment.  Breast Cancer  Practice breast self-awareness. This means understanding how your breasts normally appear and feel.  It also means doing regular breast self-exams. Let your health care provider know about any changes, no matter how small.  If you are in your 20s or 30s, you should have a clinical breast exam (CBE) by a health care provider every 1-3 years as part of a regular health exam.  If you are 60 or older, have a CBE every year. Also consider having a breast X-ray (mammogram) every year.  If you have a family history of breast cancer, talk to your health care provider about  genetic screening.  If you are at high risk for breast cancer, talk to your health care provider about having an MRI and a mammogram every year.  Breast cancer gene (BRCA) assessment is recommended for women who have family members with BRCA-related cancers. BRCA-related cancers include:  Breast.  Ovarian.  Tubal.  Peritoneal cancers.  Results of the assessment will determine the need for genetic counseling and BRCA1 and BRCA2 testing. Cervical Cancer Your health care provider may recommend that you be screened regularly for cancer of the pelvic organs (ovaries, uterus, and vagina). This screening involves a pelvic examination, including checking for microscopic changes to the surface of your cervix (Pap test). You may be encouraged to have this screening done every 3 years, beginning at age 80.  For women ages  30-65, health care providers may recommend pelvic exams and Pap testing every 3 years, or they may recommend the Pap and pelvic exam, combined with testing for human papilloma virus (HPV), every 5 years. Some types of HPV increase your risk of cervical cancer. Testing for HPV may also be done on women of any age with unclear Pap test results.  Other health care providers may not recommend any screening for nonpregnant women who are considered low risk for pelvic cancer and who do not have symptoms. Ask your health care provider if a screening pelvic exam is right for you.  If you have had past treatment for cervical cancer or a condition that could lead to cancer, you need Pap tests and screening for cancer for at least 20 years after your treatment. If Pap tests have been discontinued, your risk factors (such as having a new sexual partner) need to be reassessed to determine if screening should resume. Some women have medical problems that increase the chance of getting cervical cancer. In these cases, your health care provider may recommend more frequent screening and Pap  tests. Colorectal Cancer  This type of cancer can be detected and often prevented.  Routine colorectal cancer screening usually begins at 49 years of age and continues through 49 years of age.  Your health care provider may recommend screening at an earlier age if you have risk factors for colon cancer.  Your health care provider may also recommend using home test kits to check for hidden blood in the stool.  A small camera at the end of a tube can be used to examine your colon directly (sigmoidoscopy or colonoscopy). This is done to check for the earliest forms of colorectal cancer.  Routine screening usually begins at age 11.  Direct examination of the colon should be repeated every 5-10 years through 49 years of age. However, you may need to be screened more often if early forms of precancerous polyps or small growths are found. Skin Cancer  Check your skin from head to toe regularly.  Tell your health care provider about any new moles or changes in moles, especially if there is a change in a mole's shape or color.  Also tell your health care provider if you have a mole that is larger than the size of a pencil eraser.  Always use sunscreen. Apply sunscreen liberally and repeatedly throughout the day.  Protect yourself by wearing long sleeves, pants, a wide-brimmed hat, and sunglasses whenever you are outside. HEART DISEASE, DIABETES, AND HIGH BLOOD PRESSURE   High blood pressure causes heart disease and increases the risk of stroke. High blood pressure is more likely to develop in:  People who have blood pressure in the high end of the normal range (130-139/85-89 mm Hg).  People who are overweight or obese.  People who are African American.  If you are 12-28 years of age, have your blood pressure checked every 3-5 years. If you are 49 years of age or older, have your blood pressure checked every year. You should have your blood pressure measured twice--once when you are at a  hospital or clinic, and once when you are not at a hospital or clinic. Record the average of the two measurements. To check your blood pressure when you are not at a hospital or clinic, you can use:  An automated blood pressure machine at a pharmacy.  A home blood pressure monitor.  If you are between 85 years and 39 years old, ask  your health care provider if you should take aspirin to prevent strokes.  Have regular diabetes screenings. This involves taking a blood sample to check your fasting blood sugar level.  If you are at a normal weight and have a low risk for diabetes, have this test once every three years after 49 years of age.  If you are overweight and have a high risk for diabetes, consider being tested at a younger age or more often. PREVENTING INFECTION  Hepatitis B  If you have a higher risk for hepatitis B, you should be screened for this virus. You are considered at high risk for hepatitis B if:  You were born in a country where hepatitis B is common. Ask your health care provider which countries are considered high risk.  Your parents were born in a high-risk country, and you have not been immunized against hepatitis B (hepatitis B vaccine).  You have HIV or AIDS.  You use needles to inject street drugs.  You live with someone who has hepatitis B.  You have had sex with someone who has hepatitis B.  You get hemodialysis treatment.  You take certain medicines for conditions, including cancer, organ transplantation, and autoimmune conditions. Hepatitis C  Blood testing is recommended for:  Everyone born from [birth year] through 1964.  Anyone with known risk factors for hepatitis C. Sexually transmitted infections (STIs)  You should be screened for sexually transmitted infections (STIs) including gonorrhea and chlamydia if:  You are sexually active and are younger than 49 years of age.  You are older than 49 years of age and your health care provider tells you  that you are at risk for this type of infection.  Your sexual activity has changed since you were last screened and you are at an increased risk for chlamydia or gonorrhea. Ask your health care provider if you are at risk.  If you do not have HIV, but are at risk, it may be recommended that you take a prescription medicine daily to prevent HIV infection. This is called pre-exposure prophylaxis (PrEP). You are considered at risk if:  You are sexually active and do not regularly use condoms or know the HIV status of your partner(s).  You take drugs by injection.  You are sexually active with a partner who has HIV. Talk with your health care provider about whether you are at high risk of being infected with HIV. If you choose to begin PrEP, you should first be tested for HIV. You should then be tested every 3 months for as long as you are taking PrEP.  PREGNANCY   If you are premenopausal and you may become pregnant, ask your health care provider about preconception counseling.  If you may become pregnant, take 400 to 800 micrograms (mcg) of folic acid every day.  If you want to prevent pregnancy, talk to your health care provider about birth control (contraception). OSTEOPOROSIS AND MENOPAUSE   Osteoporosis is a disease in which the bones lose minerals and strength with aging. This can result in serious bone fractures. Your risk for osteoporosis can be identified using a bone density scan.  If you are 72 years of age or older, or if you are at risk for osteoporosis and fractures, ask your health care provider if you should be screened.  Ask your health care provider whether you should take a calcium or vitamin D supplement to lower your risk for osteoporosis.  Menopause may have certain physical symptoms and risks.  Hormone  replacement therapy may reduce some of these symptoms and risks. Talk to your health care provider about whether hormone replacement therapy is right for you.  HOME  CARE INSTRUCTIONS   Schedule regular health, dental, and eye exams.  Stay current with your immunizations.   Do not use any tobacco products including cigarettes, chewing tobacco, or electronic cigarettes.  If you are pregnant, do not drink alcohol.  If you are breastfeeding, limit how much and how often you drink alcohol.  Limit alcohol intake to no more than 1 drink per day for nonpregnant women. One drink equals 12 ounces of beer, 5 ounces of wine, or 1 ounces of hard liquor.  Do not use street drugs.  Do not share needles.  Ask your health care provider for help if you need support or information about quitting drugs.  Tell your health care provider if you often feel depressed.  Tell your health care provider if you have ever been abused or do not feel safe at home.   This information is not intended to replace advice given to you by your health care provider. Make sure you discuss any questions you have with your health care provider.   Document Released: 02/10/2011 Document Revised: 08/18/2014 Document Reviewed: 06/29/2013 Elsevier Interactive Patient Education [employer].

## 2015-09-13 ENCOUNTER — Encounter: Payer: Self-pay | Admitting: Emergency Medicine

## 2016-09-29 ENCOUNTER — Ambulatory Visit (INDEPENDENT_AMBULATORY_CARE_PROVIDER_SITE_OTHER): Payer: BC Managed Care – PPO | Admitting: Family Medicine

## 2016-09-29 ENCOUNTER — Ambulatory Visit (INDEPENDENT_AMBULATORY_CARE_PROVIDER_SITE_OTHER): Payer: BC Managed Care – PPO

## 2016-09-29 DIAGNOSIS — J029 Acute pharyngitis, unspecified: Secondary | ICD-10-CM

## 2016-09-29 DIAGNOSIS — J189 Pneumonia, unspecified organism: Secondary | ICD-10-CM | POA: Diagnosis not present

## 2016-09-29 DIAGNOSIS — R059 Cough, unspecified: Secondary | ICD-10-CM

## 2016-09-29 DIAGNOSIS — J181 Lobar pneumonia, unspecified organism: Secondary | ICD-10-CM

## 2016-09-29 DIAGNOSIS — R05 Cough: Secondary | ICD-10-CM

## 2016-09-29 DIAGNOSIS — R0600 Dyspnea, unspecified: Secondary | ICD-10-CM

## 2016-09-29 LAB — POCT RAPID STREP A (OFFICE): Rapid Strep A Screen: NEGATIVE

## 2016-09-29 MED ORDER — HYDROCODONE-HOMATROPINE 5-1.5 MG/5ML PO SYRP: ORAL_SOLUTION | ORAL | 0 refills | Status: DC

## 2016-09-29 MED ORDER — AZITHROMYCIN 250 MG PO TABS: ORAL_TABLET | ORAL | 0 refills | Status: DC

## 2016-09-29 NOTE — Patient Instructions (Addendum)
Start azithromycin today for suspected lower pneumonia, Tylenol or Motrin as needed for body aches and fever, Mucinex for cough. Hydrocodone cough syrup was prescribed if needed at night. If any increasing shortness of breath or worsening symptoms be seen here or in emergency room. If not starting to improve in the next 2-3 days, return for recheck of pneumonia.   Community-Acquired Pneumonia, Adult Pneumonia is an infection of the lungs. There are different types of pneumonia. One type can develop while a person is in a hospital. A different type, called community-acquired pneumonia, develops in people who are not, or have not recently been, in the hospital or other health care facility. What are the causes? Pneumonia may be caused by bacteria, viruses, or funguses. Community-acquired pneumonia is often caused by Streptococcus pneumonia bacteria. These bacteria are often passed from one person to another by breathing in droplets from the cough or sneeze of an infected person. What increases the risk? The condition is more likely to develop in:  People who havechronic diseases, such as chronic obstructive pulmonary disease (COPD), asthma, congestive heart failure, cystic fibrosis, diabetes, or kidney disease.  People who haveearly-stage or late-stage HIV.  People who havesickle cell disease.  People who havehad their spleen removed (splenectomy).  People who havepoor Administratordental hygiene.  People who havemedical conditions that increase the risk of breathing in (aspirating) secretions their own mouth and nose.  People who havea weakened immune system (immunocompromised).  People who smoke.  People whotravel to areas where pneumonia-causing germs commonly exist.  People whoare around animal habitats or animals that have pneumonia-causing germs, including birds, bats, rabbits, cats, and farm animals. What are the signs or symptoms? Symptoms of this condition include:  Adry  cough.  A wet (productive) cough.  Fever.  Sweating.  Chest pain, especially when breathing deeply or coughing.  Rapid breathing or difficulty breathing.  Shortness of breath.  Shaking chills.  Fatigue.  Muscle aches. How is this diagnosed? Your health care provider will take a medical history and perform a physical exam. You may also have other tests, including:  Imaging studies of your chest, including X-rays.  Tests to check your blood oxygen level and other blood gases.  Other tests on blood, mucus (sputum), fluid around your lungs (pleural fluid), and urine. If your pneumonia is severe, other tests may be done to identify the specific cause of your illness. How is this treated? The type of treatment that you receive depends on many factors, such as the cause of your pneumonia, the medicines you take, and other medical conditions that you have. For most adults, treatment and recovery from pneumonia may occur at home. In some cases, treatment must happen in a hospital. Treatment may include:  Antibiotic medicines, if the pneumonia was caused by bacteria.  Antiviral medicines, if the pneumonia was caused by a virus.  Medicines that are given by mouth or through an IV tube.  Oxygen.  Respiratory therapy. Although rare, treating severe pneumonia may include:  Mechanical ventilation. This is done if you are not breathing well on your own and you cannot maintain a safe blood oxygen level.  Thoracentesis. This procedureremoves fluid around one lung or both lungs to help you breathe better. Follow these instructions at home:  Take over-the-counter and prescription medicines only as told by your health care provider.  Only takecough medicine if you are losing sleep. Understand that cough medicine can prevent your body's natural ability to remove mucus from your lungs.  If you were  prescribed an antibiotic medicine, take it as told by your health care provider. Do not  stop taking the antibiotic even if you start to feel better.  Sleep in a semi-upright position at night. Try sleeping in a reclining chair, or place a few pillows under your head.  Do not use tobacco products, including cigarettes, chewing tobacco, and e-cigarettes. If you need help quitting, ask your health care provider.  Drink enough water to keep your urine clear or pale yellow. This will help to thin out mucus secretions in your lungs. How is this prevented? There are ways that you can decrease your risk of developing community-acquired pneumonia. Consider getting a pneumococcal vaccine if:  You are older than 50 years of age.  You are older than 50 years of age and are undergoing cancer treatment, have chronic lung disease, or have other medical conditions that affect your immune system. Ask your health care provider if this applies to you. There are different types and schedules of pneumococcal vaccines. Ask your health care provider which vaccination option is best for you. You may also prevent community-acquired pneumonia if you take these actions:  Get an influenza vaccine every year. Ask your health care provider which type of influenza vaccine is best for you.  Go to the dentist on a regular basis.  Wash your hands often. Use hand sanitizer if soap and water are not available. Contact a health care provider if:  You have a fever.  You are losing sleep because you cannot control your cough with cough medicine. Get help right away if:  You have worsening shortness of breath.  You have increased chest pain.  Your sickness becomes worse, especially if you are an older adult or have a weakened immune system.  You cough up blood. This information is not intended to replace advice given to you by your health care provider. Make sure you discuss any questions you have with your health care provider. Document Released: 07/28/2005 Document Revised: 12/06/2015 Document Reviewed:  11/22/2014 Elsevier Interactive Patient Education  2017 [employer].    IF you received an x-ray today, you will receive an invoice from [facility]. Please contact [facility] at [phone] with questions or concerns regarding your invoice.   IF you received labwork today, you will receive an invoice from [facility]. Please contact [facility] at [phone] with questions or concerns regarding your invoice.   Our billing staff will not be able to assist you with questions regarding bills from these companies.  You will be contacted with the lab results as soon as they are available. The fastest way to get your results is to activate your My Chart account. Instructions are located on the last page of this paperwork. If you have not heard from [place] regarding the results in 2 weeks, please contact this office.

## 2016-09-29 NOTE — Progress Notes (Signed)
By signing my name below, I, Mesha Guinyard, attest that this documentation has been prepared under the direction and in the presence of Meredith Staggers, MD.  Electronically Signed: Arvilla Market, Medical Scribe. 09/29/16. 3:07 PM.  Subjective:    Patient ID: Beverly Rowe, female    DOB: 1966-06-13, 51 y.o.   MRN: [id number]  HPI Chief Complaint  Patient presents with  . Sore Throat    burning, x 4 days    HPI Comments: Beverly Rowe is a 51 y.o. female who presents to the [facility] complaining of sore throat onset 3 days ago. Pt describes her sore throat as a burning sensation making it difficult to talk and reports associated sxs of dry cough, hot spells, chills, diaphoresis, nasal congestion, rhinorrhea, subjective fever, myalgias, HA that recently resolved yesterday, voice change, eye pain, and SOB while sitting. Took mucinex, echinacea, OTC cold and flu without relief of her sxs. 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  Patient Active Problem List   Diagnosis Date Noted  . GERD (gastroesophageal reflux disease) 09/10/2015  . Obese 09/10/2015  . Other bipolar disorder (HCC) 01/10/2010  . Arthritis of knee, degenerative 01/10/2010  . ANXIETY DEPRESSION 05/30/2008   Past Medical History:  Diagnosis Date  . Allergy   . Anxiety   . ANXIETY DEPRESSION   . Arthritis   . ARTHRITIS, KNEES, BILATERAL   . BIPOLAR II DISORDER    Past Surgical History:  Procedure Laterality Date  . CESAREAN SECTION    . TUBAL LIGATION     No Known Allergies Prior to Admission medications   Medication Sig Start Date End Date Taking? Authorizing Provider  ALPRAZolam Prudy Feeler) 1 MG tablet Take 0.5 mg by mouth 2 (two) times daily.   Yes Historical Provider, MD  glucosamine-chondroitin (MAX GLUCOSAMINE CHONDROITIN) 500-400 MG tablet Take 1 tablet by mouth 3 (three) times daily. 09/10/15  Yes Pincus Sanes, MD  lamoTRIgine (LAMICTAL) 150 MG tablet Take 300 mg by mouth daily.    Yes Historical Provider, MD  omeprazole (PRILOSEC) 20 MG capsule Take 1 capsule (20 mg total) by mouth daily. 09/10/15  Yes Pincus Sanes, MD  QUEtiapine (SEROQUEL) 300 MG tablet Take 300 mg by mouth at bedtime.   Yes Historical Provider, MD  ranitidine (ZANTAC) 150 MG tablet Take 1 tablet (150 mg total) by mouth at bedtime. 09/10/15  Yes Pincus Sanes, MD   Social History   Social History  . Marital status: Single    Spouse name: N/A  . Number of children: N/A  . Years of education: N/A   Occupational History  . Not on file.   Social History Main Topics  . Smoking status: Never Smoker  . Smokeless tobacco: Never Used  . Alcohol use 0.5 oz/week    1 Standard drinks or equivalent per week     Comment: occasional  . Drug use: No  . Sexual activity: Not on file   Other Topics Concern  . Not on file   Social History Narrative   No regular exercise   Review of Systems  Constitutional: Positive for chills, diaphoresis and fever.  HENT: Positive for congestion, rhinorrhea, sore throat and voice change.   Eyes: Positive for pain.  Respiratory: Positive for cough and shortness of breath.   Gastrointestinal: Negative for abdominal pain.  Endocrine: Positive for heat intolerance.  Musculoskeletal: Positive for myalgias.  Neurological: Positive for headaches.   Objective:  Physical Exam  Constitutional:  She is oriented to person, place, and time. She appears well-developed and well-nourished. No distress.  HENT:  Head: Normocephalic and atraumatic.  Right Ear: Hearing, tympanic membrane, external ear and ear canal normal.  Left Ear: Hearing, tympanic membrane, external ear and ear canal normal.  Nose: Nose normal. Right sinus exhibits no maxillary sinus tenderness and no frontal sinus tenderness. Left sinus exhibits no maxillary sinus tenderness and no frontal sinus tenderness.  Mouth/Throat: Posterior oropharyngeal erythema present. No oropharyngeal exudate.  Sinuses non tender    Eyes: Conjunctivae and EOM are normal. Pupils are equal, round, and reactive to light.  Neck: Neck supple.  Cardiovascular: Normal rate, regular rhythm, normal heart sounds and intact distal pulses.  Exam reveals no friction rub.   No murmur heard. Pulmonary/Chest: Effort normal and breath sounds normal. No respiratory distress. She has no wheezes. She has no rhonchi. She has no rales.  A few coarse breath sound in the LLL  Lymphadenopathy:    She has no cervical adenopathy.  No lymph adenopathy found  Neurological: She is alert and oriented to person, place, and time.  Skin: Skin is warm and dry. No rash noted.  Psychiatric: She has a normal mood and affect. Her behavior is normal.  Nursing note and vitals reviewed.  BP 118/74   Pulse 92   Temp 99.4 F (37.4 C) (Oral)   Resp 16   Ht 5\' 5"  (1.651 m)   Wt 164 lb (74.4 kg)   LMP 09/28/2016   SpO2 95%   BMI 27.29 kg/m    Results for orders placed or performed in visit on 09/29/16  POCT rapid strep A  Result Value Ref Range   Rapid Strep A Screen Negative Negative   Dg Chest 2 View  Result Date: 09/29/2016 CLINICAL DATA:  Cough and dyspnea.  Fever. EXAM: CHEST  2 VIEW COMPARISON:  None. FINDINGS: The cardiac silhouette, mediastinal and hilar contours are within normal limits. There is mild tortuosity of the thoracic aorta. Patchy bibasilar airspace opacities likely a combination of infiltrates and atelectasis. No pleural effusion. No pulmonary edema. The bony thorax is intact. IMPRESSION: Patchy bibasilar infiltrates and atelectasis. Electronically Signed   By: Rudie Meyer M.D.   On: 09/29/2016 15:39   Assessment & Plan:     Beverly Rowe is a 51 y.o. female Sore throat - Plan: POCT rapid strep A  Cough - Plan: DG Chest 2 View, HYDROcodone-homatropine (HYCODAN) 5-1.5 MG/5ML syrup  Dyspnea, unspecified type - Plan: DG Chest 2 View  Pneumonia of both lower lobes due to infectious organism - Plan: azithromycin (ZITHROMAX)  250 MG tablet  Initially thought possible viral illness, but bibasilar infiltrates on chest x-ray concerning for community-acquired pneumonia.   -Start azithromycin, discussed concern with QT prolongation and her Seroquel, she plans to decrease the Seroquel to one half dose while taking azithromycin as she has done in the past with other medicines that have had issues in combination with Seroquel.  - Symptomatic care discussed during the day, hydrocodone cough syrup provider for nighttime use.  - ER/RTC precautions.   Meds ordered this encounter  Medications  . HYDROcodone-homatropine (HYCODAN) 5-1.5 MG/5ML syrup    Sig: 39m by mouth a bedtime as needed for cough.    Dispense:  120 mL    Refill:  0  . azithromycin (ZITHROMAX) 250 MG tablet    Sig: Take 2 pills by mouth on day 1, then 1 pill by mouth per day on days 2 through 5.  Dispense:  6 tablet    Refill:  0   Patient Instructions    Start azithromycin today for suspected lower pneumonia, Tylenol or Motrin as needed for body aches and fever, Mucinex for cough. Hydrocodone cough syrup was prescribed if needed at night. If any increasing shortness of breath or worsening symptoms be seen here or in emergency room. If not starting to improve in the next 2-3 days, return for recheck of pneumonia.   Community-Acquired Pneumonia, Adult Pneumonia is an infection of the lungs. There are different types of pneumonia. One type can develop while a person is in a hospital. A different type, called community-acquired pneumonia, develops in people who are not, or have not recently been, in the hospital or other health care facility. What are the causes? Pneumonia may be caused by bacteria, viruses, or funguses. Community-acquired pneumonia is often caused by Streptococcus pneumonia bacteria. These bacteria are often passed from one person to another by breathing in droplets from the cough or sneeze of an infected person. What increases the  risk? The condition is more likely to develop in:  People who havechronic diseases, such as chronic obstructive pulmonary disease (COPD), asthma, congestive heart failure, cystic fibrosis, diabetes, or kidney disease.  People who haveearly-stage or late-stage HIV.  People who havesickle cell disease.  People who havehad their spleen removed (splenectomy).  People who havepoor Administrator.  People who havemedical conditions that increase the risk of breathing in (aspirating) secretions their own mouth and nose.  People who havea weakened immune system (immunocompromised).  People who smoke.  People whotravel to areas where pneumonia-causing germs commonly exist.  People whoare around animal habitats or animals that have pneumonia-causing germs, including birds, bats, rabbits, cats, and farm animals. What are the signs or symptoms? Symptoms of this condition include:  Adry cough.  A wet (productive) cough.  Fever.  Sweating.  Chest pain, especially when breathing deeply or coughing.  Rapid breathing or difficulty breathing.  Shortness of breath.  Shaking chills.  Fatigue.  Muscle aches. How is this diagnosed? Your health care provider will take a medical history and perform a physical exam. You may also have other tests, including:  Imaging studies of your chest, including X-rays.  Tests to check your blood oxygen level and other blood gases.  Other tests on blood, mucus (sputum), fluid around your lungs (pleural fluid), and urine. If your pneumonia is severe, other tests may be done to identify the specific cause of your illness. How is this treated? The type of treatment that you receive depends on many factors, such as the cause of your pneumonia, the medicines you take, and other medical conditions that you have. For most adults, treatment and recovery from pneumonia may occur at home. In some cases, treatment must happen in a hospital. Treatment  may include:  Antibiotic medicines, if the pneumonia was caused by bacteria.  Antiviral medicines, if the pneumonia was caused by a virus.  Medicines that are given by mouth or through an IV tube.  Oxygen.  Respiratory therapy. Although rare, treating severe pneumonia may include:  Mechanical ventilation. This is done if you are not breathing well on your own and you cannot maintain a safe blood oxygen level.  Thoracentesis. This procedureremoves fluid around one lung or both lungs to help you breathe better. Follow these instructions at home:  Take over-the-counter and prescription medicines only as told by your health care provider.  Only takecough medicine if you are losing sleep. Understand that  cough medicine can prevent your body's natural ability to remove mucus from your lungs.  If you were prescribed an antibiotic medicine, take it as told by your health care provider. Do not stop taking the antibiotic even if you start to feel better.  Sleep in a semi-upright position at night. Try sleeping in a reclining chair, or place a few pillows under your head.  Do not use tobacco products, including cigarettes, chewing tobacco, and e-cigarettes. If you need help quitting, ask your health care provider.  Drink enough water to keep your urine clear or pale yellow. This will help to thin out mucus secretions in your lungs. How is this prevented? There are ways that you can decrease your risk of developing community-acquired pneumonia. Consider getting a pneumococcal vaccine if:  You are older than 50 years of age.  You are older than 50 years of age and are undergoing cancer treatment, have chronic lung disease, or have other medical conditions that affect your immune system. Ask your health care provider if this applies to you. There are different types and schedules of pneumococcal vaccines. Ask your health care provider which vaccination option is best for you. You may also  prevent community-acquired pneumonia if you take these actions:  Get an influenza vaccine every year. Ask your health care provider which type of influenza vaccine is best for you.  Go to the dentist on a regular basis.  Wash your hands often. Use hand sanitizer if soap and water are not available. Contact a health care provider if:  You have a fever.  You are losing sleep because you cannot control your cough with cough medicine. Get help right away if:  You have worsening shortness of breath.  You have increased chest pain.  Your sickness becomes worse, especially if you are an older adult or have a weakened immune system.  You cough up blood. This information is not intended to replace advice given to you by your health care provider. Make sure you discuss any questions you have with your health care provider. Document Released: 07/28/2005 Document Revised: 12/06/2015 Document Reviewed: 11/22/2014 Elsevier Interactive Patient Education  2017 [employer]    IF you received an x-ray today, you will receive an invoice [facility]. Please contact [facility] at [phone] with questions or concerns regarding your invoice.   IF you received labwork today, you will receive an invoice from [facility]. Please contact [facility] at [phone] with questions or concerns regarding your invoice.   Our billing staff will not be able to assist you with questions regarding bills from these companies.  You will be contacted with the lab results as soon as they are available. The fastest way to get your results is to activate your My Chart account. Instructions are located on the last page of this paperwork. If you have not heard from us regarding the results in 2 weeks, please contact this office.      I personally performed the services described in this documentation, which was scribed in my presence. The recorded information has been reviewed and considered for  accuracy and completeness, addended by me as needed, and agree with information above.  Signed,   Meredith StaggersJeffrey Armanie Martine, MD [facility] at [facility].  09/30/16 5:48 PM

## 2016-09-30 ENCOUNTER — Ambulatory Visit (INDEPENDENT_AMBULATORY_CARE_PROVIDER_SITE_OTHER): Payer: BC Managed Care – PPO | Admitting: Family Medicine

## 2016-09-30 ENCOUNTER — Telehealth: Payer: Self-pay

## 2016-09-30 ENCOUNTER — Ambulatory Visit (INDEPENDENT_AMBULATORY_CARE_PROVIDER_SITE_OTHER): Payer: BC Managed Care – PPO

## 2016-09-30 VITALS — BP 110/66 | HR 113 | Temp 98.0°F | Resp 16 | Ht 65.0 in | Wt 166.0 lb

## 2016-09-30 DIAGNOSIS — J189 Pneumonia, unspecified organism: Secondary | ICD-10-CM

## 2016-09-30 DIAGNOSIS — R0600 Dyspnea, unspecified: Secondary | ICD-10-CM

## 2016-09-30 DIAGNOSIS — R079 Chest pain, unspecified: Secondary | ICD-10-CM

## 2016-09-30 DIAGNOSIS — J181 Lobar pneumonia, unspecified organism: Secondary | ICD-10-CM

## 2016-09-30 LAB — POCT CBC
GRANULOCYTE PERCENT: 59.3 % (ref 37–80)
HCT, POC: 35.3 % — AB (ref 37.7–47.9)
HEMOGLOBIN: 12.2 g/dL (ref 12.2–16.2)
Lymph, poc: 1.2 (ref 0.6–3.4)
MCH: 29.2 pg (ref 27–31.2)
MCHC: 34.4 g/dL (ref 31.8–35.4)
MCV: 84.9 fL (ref 80–97)
MID (cbc): 0.3 (ref 0–0.9)
MPV: 6.9 fL (ref 0–99.8)
PLATELET COUNT, POC: 211 10*3/uL (ref 142–424)
POC Granulocyte: 2.1 (ref 2–6.9)
POC LYMPH PERCENT: 33.1 %L (ref 10–50)
POC MID %: 7.6 %M (ref 0–12)
RBC: 4.16 M/uL (ref 4.04–5.48)
RDW, POC: 13.8 %
WBC: 3.6 10*3/uL — AB (ref 4.6–10.2)

## 2016-09-30 MED ORDER — ALBUTEROL SULFATE HFA 108 (90 BASE) MCG/ACT IN AERS: 1.0000 | INHALATION_SPRAY | RESPIRATORY_TRACT | 0 refills | Status: DC | PRN

## 2016-09-30 MED ORDER — ALBUTEROL SULFATE (2.5 MG/3ML) 0.083% IN NEBU
2.5000 mg | INHALATION_SOLUTION | Freq: Once | RESPIRATORY_TRACT | Status: AC
  Administered 2016-09-30: 2.5 mg via RESPIRATORY_TRACT

## 2016-09-30 NOTE — Telephone Encounter (Signed)
Pt was diagnosed pneumonia on 09/29/2016.Beverly Rowe. Chest is hurting and crackling and pt has shortness of breath  Please advise: [phone]

## 2016-09-30 NOTE — Progress Notes (Signed)
By signing my name below, I, Mesha Guinyard, attest that this documentation has been prepared under the direction and in the presence of Meredith Staggers, MD.  Electronically Signed: Arvilla Market, Medical Scribe. 09/30/16. 5:51 PM.  Subjective:    Patient ID: Beverly Rowe, female    DOB: August 02, 1966, 51 y.o.   MRN: [id number]  HPI Chief Complaint  Patient presents with  . Shortness of Breath  . Follow-up    HPI Comments: Beverly Rowe is a 51 y.o. female who presents to the [facility] for follow-up. She was dx with bibasilar PNA yesterday; started on azithromycin and hydrocodone cough syrup. O2 stat at visit yesterday 95% on room air, and nl respiratory effort. See telephone message today advised to return if dyspnea or worsening.  Pt is having worsening trouble breathing since yesterday, stridor, acute chest pain with breathing onset today, and fever of 100.1. Pt has been compliant with the azithromycin rx yesterday (2 yesterday, and 1 today) without relief of her sxs, and she took tylenol with relief of her HA.Pt had PNA 3 years in a row with trouble breathing 6 years ago. She had an inhaler rx to her once due to trouble breathing with each infection and a shot of abx once. She notes her anxiety has also gave her trouble breathing with wheezing.  Denies calf pain, calf swelling, traveling for long periods of time in car or plane, wheeze, and PMHx of blood clots, asthma, and heart issues.  Patient Active Problem List   Diagnosis Date Noted  . GERD (gastroesophageal reflux disease) 09/10/2015  . Obese 09/10/2015  . Other bipolar disorder (HCC) 01/10/2010  . Arthritis of knee, degenerative 01/10/2010  . ANXIETY DEPRESSION 05/30/2008   Past Medical History:  Diagnosis Date  . Allergy   . Anxiety   . ANXIETY DEPRESSION   . Arthritis   . ARTHRITIS, KNEES, BILATERAL   . BIPOLAR II DISORDER    Past Surgical History:  Procedure Laterality Date  . CESAREAN  SECTION    . TUBAL LIGATION     No Known Allergies Prior to Admission medications   Medication Sig Start Date End Date Taking? Authorizing Provider  ALPRAZolam Prudy Feeler) 1 MG tablet Take 0.5 mg by mouth 2 (two) times daily.   Yes Historical Provider, MD  azithromycin (ZITHROMAX) 250 MG tablet Take 2 pills by mouth on day 1, then 1 pill by mouth per day on days 2 through 5. 09/29/16  Yes Shade Flood, MD  glucosamine-chondroitin (MAX GLUCOSAMINE CHONDROITIN) 500-400 MG tablet Take 1 tablet by mouth 3 (three) times daily. 09/10/15  Yes Pincus Sanes, MD  HYDROcodone-homatropine [facility]) 5-1.5 MG/5ML syrup 44m by mouth a bedtime as needed for cough. 09/29/16  Yes Shade Flood, MD  lamoTRIgine (LAMICTAL) 150 MG tablet Take 300 mg by mouth daily.   Yes Historical Provider, MD  omeprazole (PRILOSEC) 20 MG capsule Take 1 capsule (20 mg total) by mouth daily. 09/10/15  Yes Pincus Sanes, MD  QUEtiapine (SEROQUEL) 300 MG tablet Take 300 mg by mouth at bedtime.   Yes Historical Provider, MD  ranitidine (ZANTAC) 150 MG tablet Take 1 tablet (150 mg total) by mouth at bedtime. 09/10/15  Yes Pincus Sanes, MD   Social History   Social History  . Marital status: Single    Spouse name: N/A  . Number of children: N/A  . Years of education: N/A   Occupational History  . Not on file.  Social History Main Topics  . Smoking status: Never Smoker  . Smokeless tobacco: Never Used  . Alcohol use 0.5 oz/week    1 Standard drinks or equivalent per week     Comment: occasional  . Drug use: No  . Sexual activity: Not on file   Other Topics Concern  . Not on file   Social History Narrative   No regular exercise    Review of Systems  Constitutional: Positive for fever.  Respiratory: Positive for shortness of breath and stridor. Negative for wheezing.   Cardiovascular: Positive for chest pain. Negative for palpitations.  Musculoskeletal: Negative for myalgias.   Objective:  Physical Exam    Constitutional: She appears well-developed and well-nourished. No distress.  HENT:  Head: Normocephalic and atraumatic.  Eyes: Conjunctivae are normal.  Neck: Neck supple.  Cardiovascular: Normal rate, regular rhythm and normal heart sounds.  Exam reveals no gallop and no friction rub.   No murmur heard. Pulmonary/Chest: Effort normal. She has no decreased breath sounds. She has no wheezes. She has no rhonchi. She has rales (Coarse breath sounds/rales bilaterally) in the right lower field and the left lower field.  Neurological: She is alert.  Skin: Skin is warm and dry.  Psychiatric: She has a normal mood and affect. Her behavior is normal.  Nursing note and vitals reviewed.   Vitals:   09/30/16 1657  BP: 110/66  Pulse: 95  Resp: 16  Temp: 98 F (36.7 C)  SpO2: 96%  Weight: 166 lb (75.3 kg)  Height: 5\' 5"  (1.651 m)  Body mass index is 27.62 kg/m.   Pulse 113 and O2 stat 95% with ambulation  [EKG Reading]: Sinus rhythm. No acute findings.  Results for orders placed or performed in visit on 09/30/16  POCT CBC  Result Value Ref Range   WBC 3.6 (A) 4.6 - 10.2 K/uL   Lymph, poc 1.2 0.6 - 3.4   POC LYMPH PERCENT 33.1 10 - 50 %L   MID (cbc) 0.3 0 - 0.9   POC MID % 7.6 0 - 12 %M   POC Granulocyte 2.1 2 - 6.9   Granulocyte percent 59.3 37 - 80 %G   RBC 4.16 4.04 - 5.48 M/uL   Hemoglobin 12.2 12.2 - 16.2 g/dL   HCT, POC 16.1 (A) 09.6 - 47.9 %   MCV 84.9 80 - 97 fL   MCH, POC 29.2 27 - 31.2 pg   MCHC 34.4 31.8 - 35.4 g/dL   RDW, POC 04.5 %   Platelet Count, POC 211 142 - 424 K/uL   MPV 6.9 0 - 99.8 fL   Dg Chest 2 View  Result Date: 09/30/2016 CLINICAL DATA:  51 y/o F; cough, dyspnea, worsening since yesterday. EXAM: CHEST  2 VIEW COMPARISON:  09/29/2016 chest radiograph. FINDINGS: Opacities in the bilateral lower lobes and left upper lobe lingula are worsened in comparison with prior radiographs and probably represent pneumonia. Stable cardiac silhouette. Bones are  unremarkable. No pleural effusion. IMPRESSION: Worsening lower lobe and lingula opacities probably representing pneumonia. Electronically Signed   By: Mitzi Hansen M.D.   On: 09/30/2016 18:30   Dg Chest 2 View  Result Date: 09/29/2016 CLINICAL DATA:  Cough and dyspnea.  Fever. EXAM: CHEST  2 VIEW COMPARISON:  None. FINDINGS: The cardiac silhouette, mediastinal and hilar contours are within normal limits. There is mild tortuosity of the thoracic aorta. Patchy bibasilar airspace opacities likely a combination of infiltrates and atelectasis. No pleural effusion. No pulmonary edema. The  bony thorax is intact. IMPRESSION: Patchy bibasilar infiltrates and atelectasis. Electronically Signed   By: Rudie Meyer M.D.   On: 09/29/2016 15:39   Assessment & Plan:    TALAYIA HJORT is a 51 y.o. female Dyspnea, unspecified type - Plan: POCT CBC, Pro b natriuretic peptide, DG Chest 2 View, albuterol (PROVENTIL) (2.5 MG/3ML) 0.083% nebulizer solution 2.5 mg  Pneumonia of both lower lobes due to infectious organism - Plan: POCT CBC  Chest pain, unspecified type - Plan: EKG 12-Lead  Bibasilar pneumonia with lingular infiltrate noted on x-ray which appears to be increased from previous x-ray. Reported increased dyspnea, but O2 sat stable, including with ambulation. Albuterol neb given with possibly minimal improvement, but no significant change on exam. CBC with low white blood cell indicates likely influenza or other viral illness, but outside of timing for Tamiflu both today and on initial visit. Discussed with infectious disease specialist on call who agrees this appears to be viral illness, and did not recommend any change in medications.  -Will continue azithromycin for possible secondary bacterial pneumonia coverage.  -Continue symptomatic care for cough with Mucinex or Mucinex DM during the day and hydrocodone cough syrup at night if needed  -Albuterol inhaler provided if needed, but discussed  use for wheezing or shortness of breath, and if not improved with use of inhaler, unlikely bronchospasm.  - Check BNP with dyspnea, but unlikely CHF.   -Return for repeat exam in 48 hours, sooner or to ER if worse prior  Meds ordered this encounter  Medications  . albuterol (PROVENTIL) (2.5 MG/3ML) 0.083% nebulizer solution 2.5 mg  . albuterol (PROVENTIL HFA;VENTOLIN HFA) 108 (90 Base) MCG/ACT inhaler    Sig: Inhale 1-2 puffs into the lungs every 4 (four) hours as needed for wheezing or shortness of breath.    Dispense:  1 Inhaler    Refill:  0   Patient Instructions   Although your chest x-ray does show a more prominent pneumonia, the blood count still appears to indicate this is due to a virus and most likely influenza. I did discuss your symptoms,  x-ray, and  bloodwork with infectious disease specialist and they did not recommend any changes at this time. Continue Mucinex or Mucinex DM during the day for cough, hydrocodone cough syrup at night if needed. I did prescribe an albuterol inhaler if needed for wheezing, but you do not need to use that medicine if it does not help your symptoms. If any worsening shortness of breath or other worsening symptoms, can proceed to the emergency room or return here if needed. Otherwise follow-up with me Thursday afternoon at 345 at the [address].    Viral Illness, Adult Viruses are tiny germs that can get into a person's body and cause illness. There are many different types of viruses, and they cause many types of illness. Viral illnesses can range from mild to severe. They can affect various parts of the body. Common illnesses that are caused by a virus include colds and the flu. Viral illnesses also include serious conditions such as HIV/AIDS (human immunodeficiency virus/acquired immunodeficiency syndrome). A few viruses have been linked to certain cancers. What are the causes? Many types of viruses can cause illness. Viruses invade cells  in your body, multiply, and cause the infected cells to malfunction or die. When the cell dies, it releases more of the virus. When this happens, you develop symptoms of the illness, and the virus continues to spread to other cells. If the  virus takes over the function of the cell, it can cause the cell to divide and grow out of control, as is the case when a virus causes cancer. Different viruses get into the body in different ways. You can get a virus by:  Swallowing food or water that is contaminated with the virus.  Breathing in droplets that have been coughed or sneezed into the air by an infected person.  Touching a surface that has been contaminated with the virus and then touching your eyes, nose, or mouth.  Being bitten by an insect or animal that carries the virus.  Having sexual contact with a person who is infected with the virus.  Being exposed to blood or fluids that contain the virus, either through an open cut or during a transfusion. If a virus enters your body, your body's defense system (immune system) will try to fight the virus. You may be at higher risk for a viral illness if your immune system is weak. What are the signs or symptoms? Symptoms vary depending on the type of virus and the location of the cells that it invades. Common symptoms of the main types of viral illnesses include: Cold and flu viruses  Fever.  Headache.  Sore throat.  Muscle aches.  Nasal congestion.  Cough. Digestive system (gastrointestinal) viruses  Fever.  Abdominal pain.  Nausea.  Diarrhea. Liver viruses (hepatitis)  Loss of appetite.  Tiredness.  Yellowing of the skin (jaundice). Brain and spinal cord viruses  Fever.  Headache.  Stiff neck.  Nausea and vomiting.  Confusion or sleepiness. Skin viruses  Warts.  Itching.  Rash. Sexually transmitted viruses  Discharge.  Swelling.  Redness.  Rash. How is this treated? Viruses can be difficult to  treat because they live within cells. Antibiotic medicines do not treat viruses because these drugs do not get inside cells. Treatment for a viral illness may include:  Resting and drinking plenty of fluids.  Medicines to relieve symptoms. These can include over-the-counter medicine for pain and fever, medicines for cough or congestion, and medicines to relieve diarrhea.  Antiviral medicines. These drugs are available only for certain types of viruses. They may help reduce flu symptoms if taken early. There are also many antiviral medicines for hepatitis and HIV/AIDS. Some viral illnesses can be prevented with vaccinations. A common example is the flu shot. Follow these instructions at home: Medicines  Take over-the-counter and prescription medicines only as told by your health care provider.  If you were prescribed an antiviral medicine, take it as told by your health care provider. Do not stop taking the medicine even if you start to feel better.  Be aware of when antibiotics are needed and when they are not needed. Antibiotics do not treat viruses. If your health care provider thinks that you may have a bacterial infection as well as a viral infection, you may get an antibiotic.  Do not ask for an antibiotic prescription if you have been diagnosed with a viral illness. That will not make your illness go away faster.  Frequently taking antibiotics when they are not needed can lead to antibiotic resistance. When this develops, the medicine no longer works against the bacteria that it normally fights. General instructions  Drink enough fluids to keep your urine clear or pale yellow.  Rest as much as possible.  Return to your normal activities as told by your health care provider. Ask your health care provider what activities are safe for you.  Keep all follow-up  visits as told by your health care provider. This is important. How is this prevented? Take these actions to reduce your risk  of viral infection:  Eat a healthy diet and get enough rest.  Wash your hands often with soap and water. This is especially important when you are in public places. If soap and water are not available, use hand sanitizer.  Avoid close contact with friends and family who have a viral illness.  If you travel to areas where viral gastrointestinal infection is common, avoid drinking water or eating raw food.  Keep your immunizations up to date. Get a flu shot every year as told by your health care provider.  Do not share toothbrushes, nail clippers, razors, or needles with other people.  Always practice safe sex. Contact a health care provider if:  You have symptoms of a viral illness that do not go away.  Your symptoms come back after going away.  Your symptoms get worse. Get help right away if:  You have trouble breathing.  You have a severe headache or a stiff neck.  You have severe vomiting or abdominal pain. This information is not intended to replace advice given to you by your health care provider. Make sure you discuss any questions you have with your health care provider. Document Released: 12/07/2015 Document Revised: 01/09/2016 Document Reviewed: 12/07/2015 Elsevier Interactive Patient Education  2017 [employer] Inc.  Community-Acquired Pneumonia, Adult Pneumonia is an infection of the lungs. There are different types of pneumonia. One type can develop while a person is in a hospital. A different type, called community-acquired pneumonia, develops in people who are not, or have not recently been, in the hospital or other health care facility. What are the causes? Pneumonia may be caused by bacteria, viruses, or funguses. Community-acquired pneumonia is often caused by Streptococcus pneumonia bacteria. These bacteria are often passed from one person to another by breathing in droplets from the cough or sneeze of an infected person. What increases the risk? The condition is  more likely to develop in:  People who havechronic diseases, such as chronic obstructive pulmonary disease (COPD), asthma, congestive heart failure, cystic fibrosis, diabetes, or kidney disease.  People who haveearly-stage or late-stage HIV.  People who havesickle cell disease.  People who havehad their spleen removed (splenectomy).  People who havepoor Administrator.  People who havemedical conditions that increase the risk of breathing in (aspirating) secretions their own mouth and nose.  People who havea weakened immune system (immunocompromised).  People who smoke.  People whotravel to areas where pneumonia-causing germs commonly exist.  People whoare around animal habitats or animals that have pneumonia-causing germs, including birds, bats, rabbits, cats, and farm animals. What are the signs or symptoms? Symptoms of this condition include:  Adry cough.  A wet (productive) cough.  Fever.  Sweating.  Chest pain, especially when breathing deeply or coughing.  Rapid breathing or difficulty breathing.  Shortness of breath.  Shaking chills.  Fatigue.  Muscle aches. How is this diagnosed? Your health care provider will take a medical history and perform a physical exam. You may also have other tests, including:  Imaging studies of your chest, including X-rays.  Tests to check your blood oxygen level and other blood gases.  Other tests on blood, mucus (sputum), fluid around your lungs (pleural fluid), and urine. If your pneumonia is severe, other tests may be done to identify the specific cause of your illness. How is this treated? The type of treatment that you receive  depends on many factors, such as the cause of your pneumonia, the medicines you take, and other medical conditions that you have. For most adults, treatment and recovery from pneumonia may occur at home. In some cases, treatment must happen in a hospital. Treatment may  include:  Antibiotic medicines, if the pneumonia was caused by bacteria.  Antiviral medicines, if the pneumonia was caused by a virus.  Medicines that are given by mouth or through an IV tube.  Oxygen.  Respiratory therapy. Although rare, treating severe pneumonia may include:  Mechanical ventilation. This is done if you are not breathing well on your own and you cannot maintain a safe blood oxygen level.  Thoracentesis. This procedureremoves fluid around one lung or both lungs to help you breathe better. Follow these instructions at home:  Take over-the-counter and prescription medicines only as told by your health care provider.  Only takecough medicine if you are losing sleep. Understand that cough medicine can prevent your body's natural ability to remove mucus from your lungs.  If you were prescribed an antibiotic medicine, take it as told by your health care provider. Do not stop taking the antibiotic even if you start to feel better.  Sleep in a semi-upright position at night. Try sleeping in a reclining chair, or place a few pillows under your head.  Do not use tobacco products, including cigarettes, chewing tobacco, and e-cigarettes. If you need help quitting, ask your health care provider.  Drink enough water to keep your urine clear or pale yellow. This will help to thin out mucus secretions in your lungs. How is this prevented? There are ways that you can decrease your risk of developing community-acquired pneumonia. Consider getting a pneumococcal vaccine if:  You are older than 50 years of age.  You are older than 50 years of age and are undergoing cancer treatment, have chronic lung disease, or have other medical conditions that affect your immune system. Ask your health care provider if this applies to you. There are different types and schedules of pneumococcal vaccines. Ask your health care provider which vaccination option is best for you. You may also prevent  community-acquired pneumonia if you take these actions:  Get an influenza vaccine every year. Ask your health care provider which type of influenza vaccine is best for you.  Go to the dentist on a regular basis.  Wash your hands often. Use hand sanitizer if soap and water are not available. Contact a health care provider if:  You have a fever.  You are losing sleep because you cannot control your cough with cough medicine. Get help right away if:  You have worsening shortness of breath.  You have increased chest pain.  Your sickness becomes worse, especially if you are an older adult or have a weakened immune system.  You cough up blood. This information is not intended to replace advice given to you by your health care provider. Make sure you discuss any questions you have with your health care provider. Document Released: 07/28/2005 Document Revised: 12/06/2015 Document Reviewed: 11/22/2014 Elsevier Interactive Patient Education  2017 [employer]   IF you received an x-ray today, you will receive an invoice from [facility]. Please contact [facility] at [phone] with questions or concerns regarding your invoice.   IF you received labwork today, you will receive an invoice from [facility]. Please contact [facility] at [phone] with questions or concerns regarding your invoice.   Our billing staff will not be able to assist you  with questions regarding bills from these companies.  You will be contacted with the lab results as soon as they are available. The fastest way to get your results is to activate your My Chart account. Instructions are located on the last page of this paperwork. If you have not heard from [place] regarding the results in 2 weeks, please contact this office.      I personally performed the services described in this documentation, which was scribed in my presence. The recorded information has been reviewed and considered for accuracy  and completeness, addended by me as needed, and agree with information above.  Signed,   Meredith Staggers, MD [facility] at [facility].  10/01/16 11:29 PM

## 2016-09-30 NOTE — Patient Instructions (Addendum)
Although your chest x-ray does show a more prominent pneumonia, the blood count still appears to indicate this is due to a virus and most likely influenza. I did discuss your symptoms,  x-ray, and  bloodwork with infectious disease specialist and they did not recommend any changes at this time. Continue Mucinex or Mucinex DM during the day for cough, hydrocodone cough syrup at night if needed. I did prescribe an albuterol inhaler if needed for wheezing, but you do not need to use that medicine if it does not help your symptoms. If any worsening shortness of breath or other worsening symptoms, can proceed to the emergency room or return here if needed. Otherwise follow-up with me Thursday afternoon at 345 at the [address].    Viral Illness, Adult Viruses are tiny germs that can get into a person's body and cause illness. There are many different types of viruses, and they cause many types of illness. Viral illnesses can range from mild to severe. They can affect various parts of the body. Common illnesses that are caused by a virus include colds and the flu. Viral illnesses also include serious conditions such as HIV/AIDS (human immunodeficiency virus/acquired immunodeficiency syndrome). A few viruses have been linked to certain cancers. What are the causes? Many types of viruses can cause illness. Viruses invade cells in your body, multiply, and cause the infected cells to malfunction or die. When the cell dies, it releases more of the virus. When this happens, you develop symptoms of the illness, and the virus continues to spread to other cells. If the virus takes over the function of the cell, it can cause the cell to divide and grow out of control, as is the case when a virus causes cancer. Different viruses get into the body in different ways. You can get a virus by:  Swallowing food or water that is contaminated with the virus.  Breathing in droplets that have been coughed or sneezed into  the air by an infected person.  Touching a surface that has been contaminated with the virus and then touching your eyes, nose, or mouth.  Being bitten by an insect or animal that carries the virus.  Having sexual contact with a person who is infected with the virus.  Being exposed to blood or fluids that contain the virus, either through an open cut or during a transfusion. If a virus enters your body, your body's defense system (immune system) will try to fight the virus. You may be at higher risk for a viral illness if your immune system is weak. What are the signs or symptoms? Symptoms vary depending on the type of virus and the location of the cells that it invades. Common symptoms of the main types of viral illnesses include: Cold and flu viruses  Fever.  Headache.  Sore throat.  Muscle aches.  Nasal congestion.  Cough. Digestive system (gastrointestinal) viruses  Fever.  Abdominal pain.  Nausea.  Diarrhea. Liver viruses (hepatitis)  Loss of appetite.  Tiredness.  Yellowing of the skin (jaundice). Brain and spinal cord viruses  Fever.  Headache.  Stiff neck.  Nausea and vomiting.  Confusion or sleepiness. Skin viruses  Warts.  Itching.  Rash. Sexually transmitted viruses  Discharge.  Swelling.  Redness.  Rash. How is this treated? Viruses can be difficult to treat because they live within cells. Antibiotic medicines do not treat viruses because these drugs do not get inside cells. Treatment for a viral illness may include:  Resting and  drinking plenty of fluids.  Medicines to relieve symptoms. These can include over-the-counter medicine for pain and fever, medicines for cough or congestion, and medicines to relieve diarrhea.  Antiviral medicines. These drugs are available only for certain types of viruses. They may help reduce flu symptoms if taken early. There are also many antiviral medicines for hepatitis and HIV/AIDS. Some viral  illnesses can be prevented with vaccinations. A common example is the flu shot. Follow these instructions at home: Medicines  Take over-the-counter and prescription medicines only as told by your health care provider.  If you were prescribed an antiviral medicine, take it as told by your health care provider. Do not stop taking the medicine even if you start to feel better.  Be aware of when antibiotics are needed and when they are not needed. Antibiotics do not treat viruses. If your health care provider thinks that you may have a bacterial infection as well as a viral infection, you may get an antibiotic.  Do not ask for an antibiotic prescription if you have been diagnosed with a viral illness. That will not make your illness go away faster.  Frequently taking antibiotics when they are not needed can lead to antibiotic resistance. When this develops, the medicine no longer works against the bacteria that it normally fights. General instructions  Drink enough fluids to keep your urine clear or pale yellow.  Rest as much as possible.  Return to your normal activities as told by your health care provider. Ask your health care provider what activities are safe for you.  Keep all follow-up visits as told by your health care provider. This is important. How is this prevented? Take these actions to reduce your risk of viral infection:  Eat a healthy diet and get enough rest.  Wash your hands often with soap and water. This is especially important when you are in public places. If soap and water are not available, use hand sanitizer.  Avoid close contact with friends and family who have a viral illness.  If you travel to areas where viral gastrointestinal infection is common, avoid drinking water or eating raw food.  Keep your immunizations up to date. Get a flu shot every year as told by your health care provider.  Do not share toothbrushes, nail clippers, razors, or needles with other  people.  Always practice safe sex. Contact a health care provider if:  You have symptoms of a viral illness that do not go away.  Your symptoms come back after going away.  Your symptoms get worse. Get help right away if:  You have trouble breathing.  You have a severe headache or a stiff neck.  You have severe vomiting or abdominal pain. This information is not intended to replace advice given to you by your health care provider. Make sure you discuss any questions you have with your health care provider. Document Released: 12/07/2015 Document Revised: 01/09/2016 Document Reviewed: 12/07/2015 Elsevier Interactive Patient Education  2017 [employer] Inc.  Community-Acquired Pneumonia, Adult Pneumonia is an infection of the lungs. There are different types of pneumonia. One type can develop while a person is in a hospital. A different type, called community-acquired pneumonia, develops in people who are not, or have not recently been, in the hospital or other health care facility. What are the causes? Pneumonia may be caused by bacteria, viruses, or funguses. Community-acquired pneumonia is often caused by Streptococcus pneumonia bacteria. These bacteria are often passed from one person to another by breathing in  droplets from the cough or sneeze of an infected person. What increases the risk? The condition is more likely to develop in:  People who havechronic diseases, such as chronic obstructive pulmonary disease (COPD), asthma, congestive heart failure, cystic fibrosis, diabetes, or kidney disease.  People who haveearly-stage or late-stage HIV.  People who havesickle cell disease.  People who havehad their spleen removed (splenectomy).  People who havepoor Administrator.  People who havemedical conditions that increase the risk of breathing in (aspirating) secretions their own mouth and nose.  People who havea weakened immune system (immunocompromised).  People who  smoke.  People whotravel to areas where pneumonia-causing germs commonly exist.  People whoare around animal habitats or animals that have pneumonia-causing germs, including birds, bats, rabbits, cats, and farm animals. What are the signs or symptoms? Symptoms of this condition include:  Adry cough.  A wet (productive) cough.  Fever.  Sweating.  Chest pain, especially when breathing deeply or coughing.  Rapid breathing or difficulty breathing.  Shortness of breath.  Shaking chills.  Fatigue.  Muscle aches. How is this diagnosed? Your health care provider will take a medical history and perform a physical exam. You may also have other tests, including:  Imaging studies of your chest, including X-rays.  Tests to check your blood oxygen level and other blood gases.  Other tests on blood, mucus (sputum), fluid around your lungs (pleural fluid), and urine. If your pneumonia is severe, other tests may be done to identify the specific cause of your illness. How is this treated? The type of treatment that you receive depends on many factors, such as the cause of your pneumonia, the medicines you take, and other medical conditions that you have. For most adults, treatment and recovery from pneumonia may occur at home. In some cases, treatment must happen in a hospital. Treatment may include:  Antibiotic medicines, if the pneumonia was caused by bacteria.  Antiviral medicines, if the pneumonia was caused by a virus.  Medicines that are given by mouth or through an IV tube.  Oxygen.  Respiratory therapy. Although rare, treating severe pneumonia may include:  Mechanical ventilation. This is done if you are not breathing well on your own and you cannot maintain a safe blood oxygen level.  Thoracentesis. This procedureremoves fluid around one lung or both lungs to help you breathe better. Follow these instructions at home:  Take over-the-counter and prescription medicines  only as told by your health care provider.  Only takecough medicine if you are losing sleep. Understand that cough medicine can prevent your body's natural ability to remove mucus from your lungs.  If you were prescribed an antibiotic medicine, take it as told by your health care provider. Do not stop taking the antibiotic even if you start to feel better.  Sleep in a semi-upright position at night. Try sleeping in a reclining chair, or place a few pillows under your head.  Do not use tobacco products, including cigarettes, chewing tobacco, and e-cigarettes. If you need help quitting, ask your health care provider.  Drink enough water to keep your urine clear or pale yellow. This will help to thin out mucus secretions in your lungs. How is this prevented? There are ways that you can decrease your risk of developing community-acquired pneumonia. Consider getting a pneumococcal vaccine if:  You are older than 50 years of age.  You are older than 50 years of age and are undergoing cancer treatment, have chronic lung disease, or have other medical conditions  that affect your immune system. Ask your health care provider if this applies to you. There are different types and schedules of pneumococcal vaccines. Ask your health care provider which vaccination option is best for you. You may also prevent community-acquired pneumonia if you take these actions:  Get an influenza vaccine every year. Ask your health care provider which type of influenza vaccine is best for you.  Go to the dentist on a regular basis.  Wash your hands often. Use hand sanitizer if soap and water are not available. Contact a health care provider if:  You have a fever.  You are losing sleep because you cannot control your cough with cough medicine. Get help right away if:  You have worsening shortness of breath.  You have increased chest pain.  Your sickness becomes worse, especially if you are an older adult or  have a weakened immune system.  You cough up blood. This information is not intended to replace advice given to you by your health care provider. Make sure you discuss any questions you have with your health care provider. Document Released: 07/28/2005 Document Revised: 12/06/2015 Document Reviewed: 11/22/2014 Elsevier Interactive Patient Education  2017 [employer]   IF you received an x-ray today, you will receive an invoice from [facility]. Please contact [facility] at [phone] with questions or concerns regarding your invoice.   IF you received labwork today, you will receive an invoice from [facility]. Please contact [facility] at [phone] with questions or concerns regarding your invoice.   Our billing staff will not be able to assist you with questions regarding bills from these companies.  You will be contacted with the lab results as soon as they are available. The fastest way to get your results is to activate your My Chart account. Instructions are located on the last page of this paperwork. If you have not heard from us regarding the results in 2 weeks, please contact this office.

## 2016-09-30 NOTE — Telephone Encounter (Signed)
She had the first pills yesterday, and took her second dose.  Complains that she is having increased difficulty with breathing. She has asked that she get an albuterol.  I advised her that this would be unlikely with the dyspnea and should return or go to the ED.  Discussed with Dr. Neva SeatGreene, the provider who saw her yesterday for pneumonia.  He agrees.  Transferred to scheduling.

## 2016-10-01 LAB — PRO B NATRIURETIC PEPTIDE: NT-PRO BNP: 6 pg/mL (ref 0–249)

## 2016-10-02 ENCOUNTER — Ambulatory Visit (INDEPENDENT_AMBULATORY_CARE_PROVIDER_SITE_OTHER): Payer: BC Managed Care – PPO | Admitting: Family Medicine

## 2016-10-02 ENCOUNTER — Encounter: Payer: Self-pay | Admitting: Family Medicine

## 2016-10-02 VITALS — BP 127/75 | HR 91 | Temp 98.3°F | Resp 16 | Ht 65.0 in | Wt 164.0 lb

## 2016-10-02 DIAGNOSIS — J181 Lobar pneumonia, unspecified organism: Principal | ICD-10-CM

## 2016-10-02 DIAGNOSIS — J111 Influenza due to unidentified influenza virus with other respiratory manifestations: Secondary | ICD-10-CM

## 2016-10-02 DIAGNOSIS — R69 Illness, unspecified: Secondary | ICD-10-CM

## 2016-10-02 DIAGNOSIS — J189 Pneumonia, unspecified organism: Secondary | ICD-10-CM | POA: Diagnosis not present

## 2016-10-02 NOTE — Patient Instructions (Addendum)
  Advised to continue same treatment plan and recheck in the next 4-5 days. Sooner if any increasing dyspnea, worsening fevers, or other worsening symptoms.   IF you received an x-ray today, you will receive an invoice from [facility]. Please contact [facility] at [phone] with questions or concerns regarding your invoice.   IF you received labwork today, you will receive an invoice from [facility]. Please contact [facility] at [phone] with questions or concerns regarding your invoice.   Our billing staff will not be able to assist you with questions regarding bills from these companies.  You will be contacted with the lab results as soon as they are available. The fastest way to get your results is to activate your My Chart account. Instructions are located on the last page of this paperwork. If you have not heard from us regarding the results in 2 weeks, please contact this office.

## 2016-10-02 NOTE — Progress Notes (Signed)
By signing my name below, I, Mesha Guinyard, attest that this documentation has been prepared under the direction and in the presence of Meredith Staggers, MD.  Electronically Signed: Arvilla Market, Medical Scribe. 10/02/16. 4:46 PM.  Subjective:    Patient ID: Beverly Rowe, female    DOB: 1966-06-02, 51 y.o.   MRN: [id number]  HPI Chief Complaint  Patient presents with  . Follow-up    pnuemonia    HPI Comments: Beverly Rowe is a 51 y.o. female who presents to the [facility] for [facility] follow-up with suspected influenza. She was treated with azithromycin for possible secndary bacterial component. O2 sat stable with ambulation 2 days ago. Discussed with infectious disease at last visit without any recommended changes in treatment. Here for recheck.   Reports feeling the same as she did yesterday, and measured a low grade fever of 99 last night. No worsening.  Used albuterol 3x since it's been rx and it's given some relief her of her sxs. Pt has been compliant with mucinex (taking it during the day) and hydrocodone cough syrup (taking it at night) that gives relief of her cough allowing her to sleep. Denies feeling SOB or other worsening sx's.   Patient Active Problem List   Diagnosis Date Noted  . GERD (gastroesophageal reflux disease) 09/10/2015  . Obese 09/10/2015  . Other bipolar disorder (HCC) 01/10/2010  . Arthritis of knee, degenerative 01/10/2010  . ANXIETY DEPRESSION 05/30/2008   Past Medical History:  Diagnosis Date  . Allergy   . Anxiety   . ANXIETY DEPRESSION   . Arthritis   . ARTHRITIS, KNEES, BILATERAL   . BIPOLAR II DISORDER    Past Surgical History:  Procedure Laterality Date  . CESAREAN SECTION    . TUBAL LIGATION     No Known Allergies Prior to Admission medications   Medication Sig Start Date End Date Taking? Authorizing Provider  albuterol (PROVENTIL HFA;VENTOLIN HFA) 108 (90 Base) MCG/ACT inhaler Inhale 1-2 puffs into the lungs  every 4 (four) hours as needed for wheezing or shortness of breath. 09/30/16  Yes Shade Flood, MD  ALPRAZolam Prudy Feeler) 1 MG tablet Take 0.5 mg by mouth 2 (two) times daily.   Yes Historical Provider, MD  azithromycin (ZITHROMAX) 250 MG tablet Take 2 pills by mouth on day 1, then 1 pill by mouth per day on days 2 through 5. 09/29/16  Yes Shade Flood, MD  glucosamine-chondroitin (MAX GLUCOSAMINE CHONDROITIN) 500-400 MG tablet Take 1 tablet by mouth 3 (three) times daily. 09/10/15  Yes Pincus Sanes, MD  HYDROcodone-homatropine [facility]) 5-1.5 MG/5ML syrup 86m by mouth a bedtime as needed for cough. 09/29/16  Yes Shade Flood, MD  lamoTRIgine (LAMICTAL) 150 MG tablet Take 300 mg by mouth daily.   Yes Historical Provider, MD  omeprazole (PRILOSEC) 20 MG capsule Take 1 capsule (20 mg total) by mouth daily. 09/10/15  Yes Pincus Sanes, MD  QUEtiapine (SEROQUEL) 300 MG tablet Take 300 mg by mouth at bedtime.   Yes Historical Provider, MD  ranitidine (ZANTAC) 150 MG tablet Take 1 tablet (150 mg total) by mouth at bedtime. 09/10/15  Yes Pincus Sanes, MD   Social History   Social History  . Marital status: Married    Spouse name: N/A  . Number of children: N/A  . Years of education: N/A   Occupational History  . Not on file.   Social History Main Topics  . Smoking status: Never Smoker  .  Smokeless tobacco: Never Used  . Alcohol use 0.5 oz/week    1 Standard drinks or equivalent per week     Comment: occasional  . Drug use: No  . Sexual activity: Not on file   Other Topics Concern  . Not on file   Social History Narrative   No regular exercise   Review of Systems  Constitutional: Positive for fever (low grade).  Respiratory: Positive for cough. Negative for shortness of breath and wheezing.   Psychiatric/Behavioral: Negative for sleep disturbance.   Objective:  Physical Exam  Constitutional: She is oriented to person, place, and time. She appears well-developed and  well-nourished. No distress.  HENT:  Head: Normocephalic and atraumatic.  Right Ear: Hearing, tympanic membrane, external ear and ear canal normal.  Left Ear: Hearing, tympanic membrane, external ear and ear canal normal.  Nose: Nose normal.  Mouth/Throat: Oropharynx is clear and moist. No oropharyngeal exudate.  Eyes: Conjunctivae and EOM are normal. Pupils are equal, round, and reactive to light.  Neck: Neck supple.  Cardiovascular: Normal rate, regular rhythm, normal heart sounds and intact distal pulses.  Exam reveals no gallop and no friction rub.   No murmur heard. Pulmonary/Chest: Effort normal. No respiratory distress. She has no decreased breath sounds. She has no wheezes. She has rhonchi in the right lower field and the left lower field. She has no rales.  Neurological: She is alert and oriented to person, place, and time.  Skin: Skin is warm and dry. No rash noted.  Psychiatric: She has a normal mood and affect. Her behavior is normal.  Nursing note and vitals reviewed.   Vitals:   10/02/16 1602  BP: 127/75  Pulse: 91  Resp: 16  Temp: 98.3 F (36.8 C)  TempSrc: Oral  SpO2: 96%  Weight: 164 lb (74.4 kg)  Height: 5\' 5"  (1.651 m)  Body mass index is 27.29 kg/m. Assessment & Plan:  Beverly Rowe is a 51 y.o. female Pneumonia of both lower lobes due to infectious organism  Influenza-like illness  Bilateral lower lobe pneumonia with lingular infiltrate seen on most recent chest x-ray 2 days ago. Low normal WBC on CBC suggestive of viral picture and suspected influenza. As above, was discussed with infectious disease, and continued on azithromycin for possible secondary bacterial component. Symptomatically he appears stable, exam is reassuring with reassuring vitals in office today.  -continue azithromycin, symptomatic care for cough with Mucinex during the day, hydrocodone at night.   -albuterol if needed for wheezing, RTC precautions if frequent or persistent use of  albuterol.  - Recheck in 4 days, out of work until that time.   -RTC or ER eval sooner if worse.  No orders of the defined types were placed in this encounter.  Patient Instructions    Advised to continue same treatment plan and recheck in the next 4-5 days. Sooner if any increasing dyspnea, worsening fevers, or other worsening symptoms.   IF you received an x-ray today, you will receive an invoice from [facility]. Please contact [facility] at [phone] with questions or concerns regarding your invoice.   IF you received labwork today, you will receive an invoice from [facility]. Please contact [facility] at [phone] with questions or concerns regarding your invoice.   Our billing staff will not be able to assist you with questions regarding bills from these companies.  You will be contacted with the lab results as soon as they are available. The fastest way to get your results is to  activate your My Chart account. Instructions are located on the last page of this paperwork. If you have not heard from [place] regarding the results in 2 weeks, please contact this office.       I personally performed the services described in this documentation, which was scribed in my presence. The recorded information has been reviewed and considered for accuracy and completeness, addended by me as needed, and agree with information above.  Signed,   Meredith Staggers, MD [facility].  10/02/16 7:39 PM

## 2016-10-06 ENCOUNTER — Ambulatory Visit (INDEPENDENT_AMBULATORY_CARE_PROVIDER_SITE_OTHER): Payer: BC Managed Care – PPO | Admitting: Family Medicine

## 2016-10-06 DIAGNOSIS — J181 Lobar pneumonia, unspecified organism: Principal | ICD-10-CM

## 2016-10-06 DIAGNOSIS — J189 Pneumonia, unspecified organism: Secondary | ICD-10-CM | POA: Diagnosis not present

## 2016-10-06 NOTE — Patient Instructions (Addendum)
  Thank you for coming in,   Please follow up with us if you develop fevers or if you feel worse.     Please feel free to call with any questions or concerns at any time, at [phone]. --Dr. Jordan LikesSchmitz    IF you received an x-ray today, you will receive an invoice from [facility]. Please contact [facility] at [phone] with questions or concerns regarding your invoice.   IF you received labwork today, you will receive an invoice from [facility]. Please contact [facility] at [phone] with questions or concerns regarding your invoice.   Our billing staff will not be able to assist you with questions regarding bills from these companies.  You will be contacted with the lab results as soon as they are available. The fastest way to get your results is to activate your My Chart account. Instructions are located on the last page of this paperwork. If you have not heard from us regarding the results in 2 weeks, please contact this office.

## 2016-10-06 NOTE — Assessment & Plan Note (Signed)
She has had improvement of her symptoms and has been fever free. Completed antibiotics.  - supportive care  - work note provided.  - given indications for follow up.

## 2016-10-06 NOTE — Progress Notes (Signed)
     Subjective:    Patient ID: Beverly PastorBarbara M Rowe, female    DOB: 20-Dec-1965, 51 y.o.   MRN: [id number]  Chief Complaint  Patient presents with  . Follow-up    PNEUMONIA    PCP: Pincus SanesStacy J Burns, MD  HPI  This is a 51 y.o. female who is presenting with pneumonia follow up. She was seen on 2/22 and a lower lobe infiltrate was seen on chest x-ray. She has taken azithromycin and completed course. Still has a cough. Her last fever was on Thursday.   Review of Systems  ROS: No unexpected weight loss, fever, chills, swelling, instability, muscle pain, numbness/tingling, redness, otherwise see HPI      Patient Active Problem List   Diagnosis Date Noted  . Pneumonia of both lower lobes 10/06/2016  . GERD (gastroesophageal reflux disease) 09/10/2015  . Obese 09/10/2015  . Other bipolar disorder (HCC) 01/10/2010  . Arthritis of knee, degenerative 01/10/2010  . ANXIETY DEPRESSION 05/30/2008     Prior to Admission medications   Medication Sig Start Date End Date Taking? Authorizing Provider  albuterol (PROVENTIL HFA;VENTOLIN HFA) 108 (90 Base) MCG/ACT inhaler Inhale 1-2 puffs into the lungs every 4 (four) hours as needed for wheezing or shortness of breath. 09/30/16  Yes Shade FloodJeffrey R Greene, MD  ALPRAZolam Prudy Feeler(XANAX) 1 MG tablet Take 0.5 mg by mouth 2 (two) times daily.   Yes Historical Provider, MD  glucosamine-chondroitin (MAX GLUCOSAMINE CHONDROITIN) 500-400 MG tablet Take 1 tablet by mouth 3 (three) times daily. 09/10/15  Yes Pincus SanesStacy J Burns, MD  HYDROcodone-homatropine [facility](HYCODAN) 5-1.5 MG/5ML syrup 8052m by mouth a bedtime as needed for cough. 09/29/16  Yes Shade FloodJeffrey R Greene, MD  lamoTRIgine (LAMICTAL) 150 MG tablet Take 300 mg by mouth daily.   Yes Historical Provider, MD  omeprazole (PRILOSEC) 20 MG capsule Take 1 capsule (20 mg total) by mouth daily. 09/10/15  Yes Pincus SanesStacy J Burns, MD  QUEtiapine (SEROQUEL) 300 MG tablet Take 300 mg by mouth at bedtime.   Yes Historical Provider, MD  ranitidine  (ZANTAC) 150 MG tablet Take 1 tablet (150 mg total) by mouth at bedtime. 09/10/15  Yes Pincus SanesStacy J Burns, MD     No Known Allergies    Objective:   Physical Exam BP 110/68 (BP Location: Right Arm, Patient Position: Sitting, Cuff Size: Small)   Pulse 75   Temp 98.2 F (36.8 C) (Oral)   Resp 18   Ht 5\' 5"  (1.651 m)   Wt 159 lb (72.1 kg)   LMP 09/28/2016   SpO2 98%   BMI 26.46 kg/m  Gen: NAD, alert, cooperative with exam, well-appearing HEENT: NCAT, EOMI, clear conjunctiva, CV: RRR, good S1/S2, no murmur, no edema, capillary refill brisk  Resp: CTABL, no wheezes, non-labored Skin: no rashes, normal turgor  Neuro: no gross deficits.  Psych:  alert and oriented      Assessment & Plan:   Pneumonia of both lower lobes She has had improvement of her symptoms and has been fever free. Completed antibiotics.  - supportive care  - work note provided.  - given indications for follow up.

## 2017-03-26 ENCOUNTER — Encounter: Payer: Self-pay | Admitting: Family Medicine

## 2017-03-26 ENCOUNTER — Ambulatory Visit (INDEPENDENT_AMBULATORY_CARE_PROVIDER_SITE_OTHER): Payer: BC Managed Care – PPO | Admitting: Family Medicine

## 2017-03-26 VITALS — BP 131/79 | HR 96 | Temp 98.4°F | Resp 18 | Ht 65.0 in | Wt 174.0 lb

## 2017-03-26 DIAGNOSIS — J029 Acute pharyngitis, unspecified: Secondary | ICD-10-CM | POA: Diagnosis not present

## 2017-03-26 LAB — POCT RAPID STREP A (OFFICE): Rapid Strep A Screen: NEGATIVE

## 2017-03-26 MED ORDER — PREDNISONE 20 MG PO TABS: 60.0000 mg | ORAL_TABLET | Freq: Every day | ORAL | 0 refills | Status: AC

## 2017-03-26 NOTE — Progress Notes (Signed)
8/16/20183:56 PM  Beverly PastorBarbara M Rowe Jun 26, 1966, 51 y.o. female [id number]  Chief Complaint  Patient presents with  . Sore Throat  . Ear Fullness    Bilateral    HPI:   Patient is a 51 y.o. female who presents today for 6 days of worsening sore throat and ear pressure. Started Saturday when awoke from sleep with sign sore throat, body aches, malaise, chills, since then those symptoms have resolved but sore throat has been worsening. Feels she is very swollen, has difficulty swallowing due to pain. No SOB, minimal cough, Had bee sting 6 days ago, managed with benadryl. Husband sick 3 weeks ago. Has been doing day quil, ibuprofen as needed with minimal relief.  [facility] 2/9 03/26/2017 10/06/2016 10/02/2016  Decreased Interest 0 0 0  Down, Depressed, Hopeless 0 0 0  PHQ - 2 Score 0 0 0  Altered sleeping - - -  Tired, decreased energy - - -  Change in appetite - - -  Feeling bad or failure about yourself  - - -  Trouble concentrating - - -  Moving slowly or fidgety/restless - - -  Suicidal thoughts - - -  PHQ-9 Score - - -  Difficult doing work/chores - - -    No Known Allergies  Current Outpatient Prescriptions on File Prior to Visit  Medication Sig Dispense Refill  . albuterol (PROVENTIL HFA;VENTOLIN HFA) 108 (90 Base) MCG/ACT inhaler Inhale 1-2 puffs into the lungs every 4 (four) hours as needed for wheezing or shortness of breath. 1 Inhaler 0  . ALPRAZolam (XANAX) 1 MG tablet Take 0.5 mg by mouth 2 (two) times daily.    Marland Kitchen. glucosamine-chondroitin (MAX GLUCOSAMINE CHONDROITIN) 500-400 MG tablet Take 1 tablet by mouth 3 (three) times daily.    Marland Kitchen. lamoTRIgine (LAMICTAL) 150 MG tablet Take 300 mg by mouth daily.    Marland Kitchen. omeprazole (PRILOSEC) 20 MG capsule Take 1 capsule (20 mg total) by mouth daily. 30 capsule 5  . QUEtiapine (SEROQUEL) 300 MG tablet Take 300 mg by mouth at bedtime.    . ranitidine (ZANTAC) 150 MG tablet Take 1 tablet (150 mg total) by mouth at bedtime.      Marland Kitchen. HYDROcodone-homatropine (HYCODAN) 5-1.5 MG/5ML syrup 2427m by mouth a bedtime as needed for cough. (Patient not taking: Reported on 03/26/2017) 120 mL 0   No current facility-administered medications on file prior to visit.     Past Medical History:  Diagnosis Date  . Allergy   . Anxiety   . ANXIETY DEPRESSION   . Arthritis   . ARTHRITIS, KNEES, BILATERAL   . BIPOLAR II DISORDER     Past Surgical History:  Procedure Laterality Date  . CESAREAN SECTION    . TUBAL LIGATION      Social History  Substance Use Topics  . Smoking status: Never Smoker  . Smokeless tobacco: Never Used  . Alcohol use 0.5 oz/week    1 Standard drinks or equivalent per week     Comment: occasional    Family History  Problem Relation Age of Onset  . Diabetes Father   . Mental illness Father   . Diabetes Paternal Grandmother   . Stroke Paternal Grandmother   . Mental illness Paternal Grandmother   . Heart disease Paternal Grandfather     Review of Systems  Constitutional: Positive for chills and malaise/fatigue. Negative for fever.  HENT: Positive for ear pain and sore throat. Negative for congestion.   Respiratory: Positive for cough. Negative for  sputum production and shortness of breath.   Cardiovascular: Negative for chest pain, palpitations and leg swelling.  Gastrointestinal: Negative for abdominal pain, nausea and vomiting.     OBJECTIVE:  Vitals:   03/26/17 1539  Weight: 174 lb (78.9 kg)  Height: 5\' 5"  (1.651 m)    Physical Exam  Constitutional: She is oriented to person, place, and time and well-developed, well-nourished, and in no distress.  HENT:  Head: Normocephalic and atraumatic.  Right Ear: Hearing, tympanic membrane, external ear and ear canal normal.  Left Ear: Hearing, tympanic membrane, external ear and ear canal normal.  Mouth/Throat: Posterior oropharyngeal edema and posterior oropharyngeal erythema present. No oropharyngeal exudate.  Eyes: Pupils are equal,  round, and reactive to light. EOM are normal.  Neck: Neck supple.  Cardiovascular: Normal rate, regular rhythm and normal heart sounds.  Exam reveals no gallop and no friction rub.   No murmur heard. Pulmonary/Chest: Effort normal and breath sounds normal. She has no wheezes. She has no rales.  Lymphadenopathy:    She has cervical adenopathy.  Neurological: She is alert and oriented to person, place, and time. Gait normal.  Skin: Skin is warm and dry.    Results for orders placed or performed in visit on 03/26/17  Rapid Strep A  Result Value Ref Range   Rapid Strep A Screen Negative Negative     ASSESSMENT and PLAN:  1. Acute pharyngitis, unspecified etiology Most likely viral, due to duration and severity of symptoms treating with oral steroids x 1. Discussed continued supportive measures. Discussed ER and RtC precautions.  - Rapid Strep A - negative - predniSONE (DELTASONE) 20 MG tablet; Take 3 tablets (60 mg total) by mouth daily with breakfast.  Dispense: 3 tablet; Refill: 0   Meds ordered this encounter  Medications  . predniSONE (DELTASONE) 20 MG tablet    Sig: Take 3 tablets (60 mg total) by mouth daily with breakfast.    Dispense:  3 tablet    Refill:  0       Myles Lipps, MD Primary Care at [facility] [address] [place], [place] [address] Ph.  [phone] Fax [phone]

## 2017-03-26 NOTE — Patient Instructions (Signed)
     IF you received an x-ray today, you will receive an invoice from [facility]. Please contact [facility] at [phone] with questions or concerns regarding your invoice.   IF you received labwork today, you will receive an invoice from [facility]. Please contact [facility] at [phone] with questions or concerns regarding your invoice.   Our billing staff will not be able to assist you with questions regarding bills from these companies.  You will be contacted with the lab results as soon as they are available. The fastest way to get your results is to activate your My Chart account. Instructions are located on the last page of this paperwork. If you have not heard from us regarding the results in 2 weeks, please contact this office.     

## 2017-04-27 ENCOUNTER — Encounter: Payer: Self-pay | Admitting: Family Medicine

## 2017-04-27 ENCOUNTER — Ambulatory Visit (INDEPENDENT_AMBULATORY_CARE_PROVIDER_SITE_OTHER): Payer: BC Managed Care – PPO | Admitting: Family Medicine

## 2017-04-27 VITALS — BP 102/70 | HR 93 | Temp 98.6°F | Resp 16 | Ht 65.0 in | Wt 166.6 lb

## 2017-04-27 DIAGNOSIS — J069 Acute upper respiratory infection, unspecified: Secondary | ICD-10-CM | POA: Diagnosis not present

## 2017-04-27 NOTE — Patient Instructions (Signed)
     IF you received an x-ray today, you will receive an invoice from [facility]. Please contact [facility] at [phone] with questions or concerns regarding your invoice.   IF you received labwork today, you will receive an invoice from [facility]. Please contact [facility] at [phone] with questions or concerns regarding your invoice.   Our billing staff will not be able to assist you with questions regarding bills from these companies.  You will be contacted with the lab results as soon as they are available. The fastest way to get your results is to activate your My Chart account. Instructions are located on the last page of this paperwork. If you have not heard from us regarding the results in 2 weeks, please contact this office.     

## 2017-04-27 NOTE — Progress Notes (Signed)
9/17/20185:03 PM  UNNAMED HINO Jun 10, 1966, 51 y.o. female [id number]  Chief Complaint  Patient presents with  . Cough     nonproductive - all SXs x 2 days  . Sore Throat    HPI:   Patient is a 51 y.o. female with past medical history significant for pneumonia who presents today for 2 days of non productive cough, sore throat, nasal congestion, chills and malaise. She is worried about PNA which she has had in the past. She denies frank fevers, SOB, sputum production. She reports her son had similar sx a week earlier. She is a Runner, broadcasting/film/video. She is a former smoker and denies h/o lung disease.   Depression screen [facility] 2/9 04/27/2017 03/26/2017 10/06/2016  Decreased Interest 0 0 0  Down, Depressed, Hopeless 0 0 0  PHQ - 2 Score 0 0 0  Altered sleeping - - -  Tired, decreased energy - - -  Change in appetite - - -  Feeling bad or failure about yourself  - - -  Trouble concentrating - - -  Moving slowly or fidgety/restless - - -  Suicidal thoughts - - -  PHQ-9 Score - - -  Difficult doing work/chores - - -    No Known Allergies  Current Outpatient Prescriptions on File Prior to Visit  Medication Sig Dispense Refill  . albuterol (PROVENTIL HFA;VENTOLIN HFA) 108 (90 Base) MCG/ACT inhaler Inhale 1-2 puffs into the lungs every 4 (four) hours as needed for wheezing or shortness of breath. 1 Inhaler 0  . ALPRAZolam (XANAX) 1 MG tablet Take 0.5 mg by mouth 2 (two) times daily.    Marland Kitchen glucosamine-chondroitin (MAX GLUCOSAMINE CHONDROITIN) 500-400 MG tablet Take 1 tablet by mouth 3 (three) times daily.    Marland Kitchen lamoTRIgine (LAMICTAL) 150 MG tablet Take 300 mg by mouth daily.    Marland Kitchen omeprazole (PRILOSEC) 20 MG capsule Take 1 capsule (20 mg total) by mouth daily. 30 capsule 5  . QUEtiapine (SEROQUEL) 300 MG tablet Take 300 mg by mouth at bedtime.    . ranitidine (ZANTAC) 150 MG tablet Take 1 tablet (150 mg total) by mouth at bedtime.    Marland Kitchen HYDROcodone-homatropine (HYCODAN) 5-1.5 MG/5ML syrup 22m by mouth  a bedtime as needed for cough. (Patient not taking: Reported on 03/26/2017) 120 mL 0   No current facility-administered medications on file prior to visit.     Past Medical History:  Diagnosis Date  . Allergy   . Anxiety   . ANXIETY DEPRESSION   . Arthritis   . ARTHRITIS, KNEES, BILATERAL   . BIPOLAR II DISORDER     Past Surgical History:  Procedure Laterality Date  . CESAREAN SECTION    . TUBAL LIGATION      Social History  Substance Use Topics  . Smoking status: Never Smoker  . Smokeless tobacco: Never Used  . Alcohol use 0.5 oz/week    1 Standard drinks or equivalent per week     Comment: occasional    Family History  Problem Relation Age of Onset  . Diabetes Father   . Mental illness Father   . Diabetes Paternal Grandmother   . Stroke Paternal Grandmother   . Mental illness Paternal Grandmother   . Heart disease Paternal Grandfather     Review of Systems  Constitutional: Positive for chills and malaise/fatigue. Negative for fever.  HENT: Positive for congestion and sore throat. Negative for ear pain and sinus pain.   Respiratory: Positive for cough. Negative for sputum production, shortness  of breath and wheezing.   Cardiovascular: Negative for chest pain and palpitations.  Gastrointestinal: Negative for abdominal pain, nausea and vomiting.     OBJECTIVE:  Blood pressure 102/70, pulse 93, temperature 98.6 F (37 C), temperature source Oral, resp. rate 16, height  (1.651 m), weight 166 lb 9.6 oz (75.6 kg), last menstrual period 04/24/2017, SpO2 97 %.  Physical Exam  Constitutional: She is oriented to person, place, and time and well-developed, well-nourished, and in no distress.  HENT:  Head: Normocephalic and atraumatic.  Right Ear: Hearing, tympanic membrane, external ear and ear canal normal.  Left Ear: Hearing, tympanic membrane, external ear and ear canal normal.  Mouth/Throat: Oropharynx is clear and moist.  Eyes: Pupils are equal, round, and  reactive to light. EOM are normal.  Neck: Neck supple.  Cardiovascular: Normal rate, regular rhythm and normal heart sounds.  Exam reveals no gallop and no friction rub.   No murmur heard. Pulmonary/Chest: Effort normal and breath sounds normal. She has no wheezes. She has no rales.  Lymphadenopathy:    She has no cervical adenopathy.  Neurological: She is alert and oriented to person, place, and time. Gait normal.  Skin: Skin is warm and dry.       ASSESSMENT and PLAN:  URI, acute  Discussed supportive measures for URI: increase hydration, rest, OTC medications, etc. RTC precautions discussed.    Myles Lipps, MD Primary Care at [facility] [address] [place], [place] [address] Ph.  [phone] Fax [phone]

## 2017-11-03 ENCOUNTER — Other Ambulatory Visit: Payer: Self-pay

## 2017-11-03 ENCOUNTER — Ambulatory Visit: Payer: BC Managed Care – PPO | Admitting: Family Medicine

## 2017-11-03 ENCOUNTER — Encounter: Payer: Self-pay | Admitting: Family Medicine

## 2017-11-03 VITALS — BP 130/70 | HR 96 | Temp 97.4°F | Ht 64.5 in | Wt 171.6 lb

## 2017-11-03 DIAGNOSIS — M545 Low back pain, unspecified: Secondary | ICD-10-CM

## 2017-11-03 MED ORDER — DICLOFENAC SODIUM 75 MG PO TBEC: 75.0000 mg | DELAYED_RELEASE_TABLET | Freq: Two times a day (BID) | ORAL | 0 refills | Status: DC

## 2017-11-03 MED ORDER — CYCLOBENZAPRINE HCL 10 MG PO TABS: 10.0000 mg | ORAL_TABLET | Freq: Three times a day (TID) | ORAL | 0 refills | Status: DC | PRN

## 2017-11-03 NOTE — Progress Notes (Signed)
3/26/20198:42 AM  Beverly PastorBarbara M Rowe 1966-03-29, 52 y.o. female [id number]  Chief Complaint  Patient presents with  . Back Pain    has had back pain for 9 days now, Noticed some swelling in the area as well. Hass been using heating pad for comfort    HPI:   Patient is a 52 y.o. female who presents today for low back pain that started after she was moving cases of water at work. She felt a sharp pain, made her knees buckle and fall down. She was stiff and could not erect herself for a while afterwards. She denies any radiation of pain. Pain is mostly on the left. She denies any numbness, tingling or weakness of LE. She denies any changes to bowel or bladder function. She denies any previous back issues. She has been taking ibuprofen, aleve and alternating heat and ice without relief.  [facility] 11/03/2017 04/27/2017 03/26/2017  Decreased Interest 0 0 0  Down, Depressed, Hopeless 0 0 0  PHQ - 2 Score 0 0 0  Altered sleeping - - -  Tired, decreased energy - - -  Change in appetite - - -  Feeling bad or failure about yourself  - - -  Trouble concentrating - - -  Moving slowly or fidgety/restless - - -  Suicidal thoughts - - -  PHQ-9 Score - - -  Difficult doing work/chores - - -    No Known Allergies  Prior to Admission medications   Medication Sig Start Date End Date Taking? Authorizing Provider  ALPRAZolam Prudy Feeler(XANAX) 1 MG tablet Take 0.5 mg by mouth 2 (two) times daily.    [provider]  glucosamine-chondroitin (MAX GLUCOSAMINE CHONDROITIN) 500-400 MG tablet Take 1 tablet by mouth 3 (three) times daily. 09/10/15   Pincus SanesBurns, Stacy J, MD  lamoTRIgine (LAMICTAL) 150 MG tablet Take 300 mg by mouth daily.    [provider]  omeprazole (PRILOSEC) 20 MG capsule Take 1 capsule (20 mg total) by mouth daily. 09/10/15   Pincus SanesBurns, Stacy J, MD  QUEtiapine (SEROQUEL) 300 MG tablet Take 300 mg by mouth at bedtime.    [provider]  ranitidine (ZANTAC) 150 MG  tablet Take 1 tablet (150 mg total) by mouth at bedtime. 09/10/15   Pincus SanesBurns, Stacy J, MD    Past Medical History:  Diagnosis Date  . Allergy   . Anxiety   . ANXIETY DEPRESSION   . Arthritis   . ARTHRITIS, KNEES, BILATERAL   . BIPOLAR II DISORDER     Past Surgical History:  Procedure Laterality Date  . CESAREAN SECTION    . TUBAL LIGATION      Social History   Tobacco Use  . Smoking status: Never Smoker  . Smokeless tobacco: Never Used  Substance Use Topics  . Alcohol use: Yes    Alcohol/week: 0.5 oz    Types: 1 Standard drinks or equivalent per week    Comment: occasional    Family History  Problem Relation Age of Onset  . Diabetes Father   . Mental illness Father   . Diabetes Paternal Grandmother   . Stroke Paternal Grandmother   . Mental illness Paternal Grandmother   . Heart disease Paternal Grandfather     ROS Per hpi  OBJECTIVE:  Blood pressure 130/70, pulse 96, temperature (!) 97.4 F (36.3 C), temperature source Oral, height 5' 4.5" (1.638 m), weight 171 lb 9.6 oz (77.8 kg), SpO2 98 %.  Physical Exam  Constitutional: She is  well-developed, well-nourished, and in no distress.  Musculoskeletal:       Lumbar back: She exhibits tenderness (left paraspinal) and spasm. She exhibits no bony tenderness.  Neurological: She has normal strength and normal reflexes. She has a normal Straight Leg Raise Test.     ASSESSMENT and PLAN  1. Left-sided low back pain without sciatica, unspecified chronicity Discussed supportive measures, new meds r/se/b and RTC precautions.  - diclofenac (VOLTAREN) 75 MG EC tablet; Take 1 tablet (75 mg total) by mouth 2 (two) times daily. - cyclobenzaprine (FLEXERIL) 10 MG tablet; Take 1 tablet (10 mg total) by mouth 3 (three) times daily as needed for muscle spasms.  Return if symptoms worsen or fail to improve.    Myles Lipps, MD Primary Care at [facility] [address] [place], [place] [address] Ph.  [phone] Fax  [phone]

## 2017-11-03 NOTE — Patient Instructions (Addendum)
1. Do not take any other NSAIDs such as ibuprofen, advil, naproxen or aleve while taking diclofenac. Always take diclofenac with food.  2. cyclobenzapril (muscl relaxant) can make you sleepy/drowsy. Do not drive while taking it. You can also take 1/2 dose if needed. 3. Continue with alternating heat and ice. Start doing gentle stretches as instructed in clinic.     IF you received an x-ray today, you will receive an [facility]. Please contact [facility] at [phone] with questions or concerns regarding your invoice.   IF you received labwork today, you will receive an invoice from [facility]. Please contact [facility] at [phone] with questions or concerns regarding your invoice.   Our billing staff will not be able to assist you with questions regarding bills from these companies.  You will be contacted with the lab results as soon as they are available. The fastest way to get your results is to activate your My Chart account. Instructions are located on the last page of this paperwork. If you have not heard from us regarding the results in 2 weeks, please contact this office.

## 2017-11-26 IMAGING — DX DG CHEST 2V
2 series · 2 of 2 positions shown · non-contrast
Comparison: None.

CLINICAL DATA: Cough and dyspnea.  Fever.

EXAM:
CHEST  2 VIEW

[chest pa]
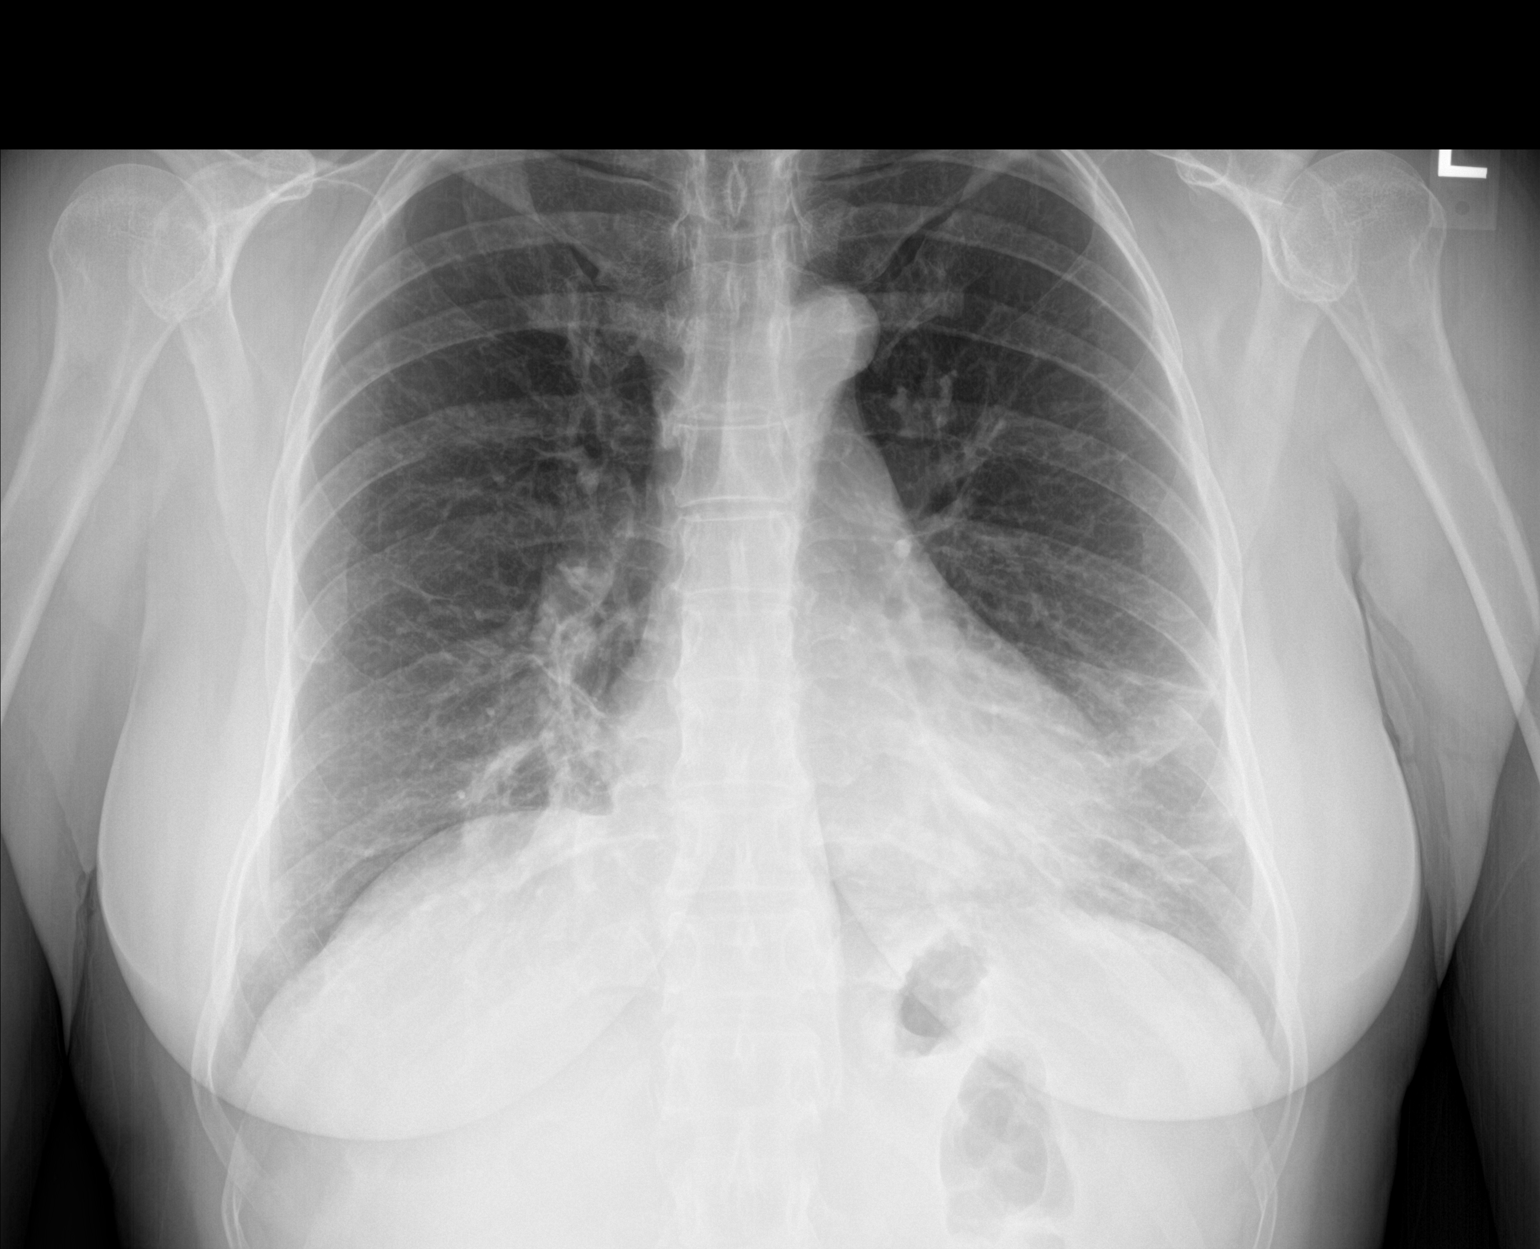

[chest lat]
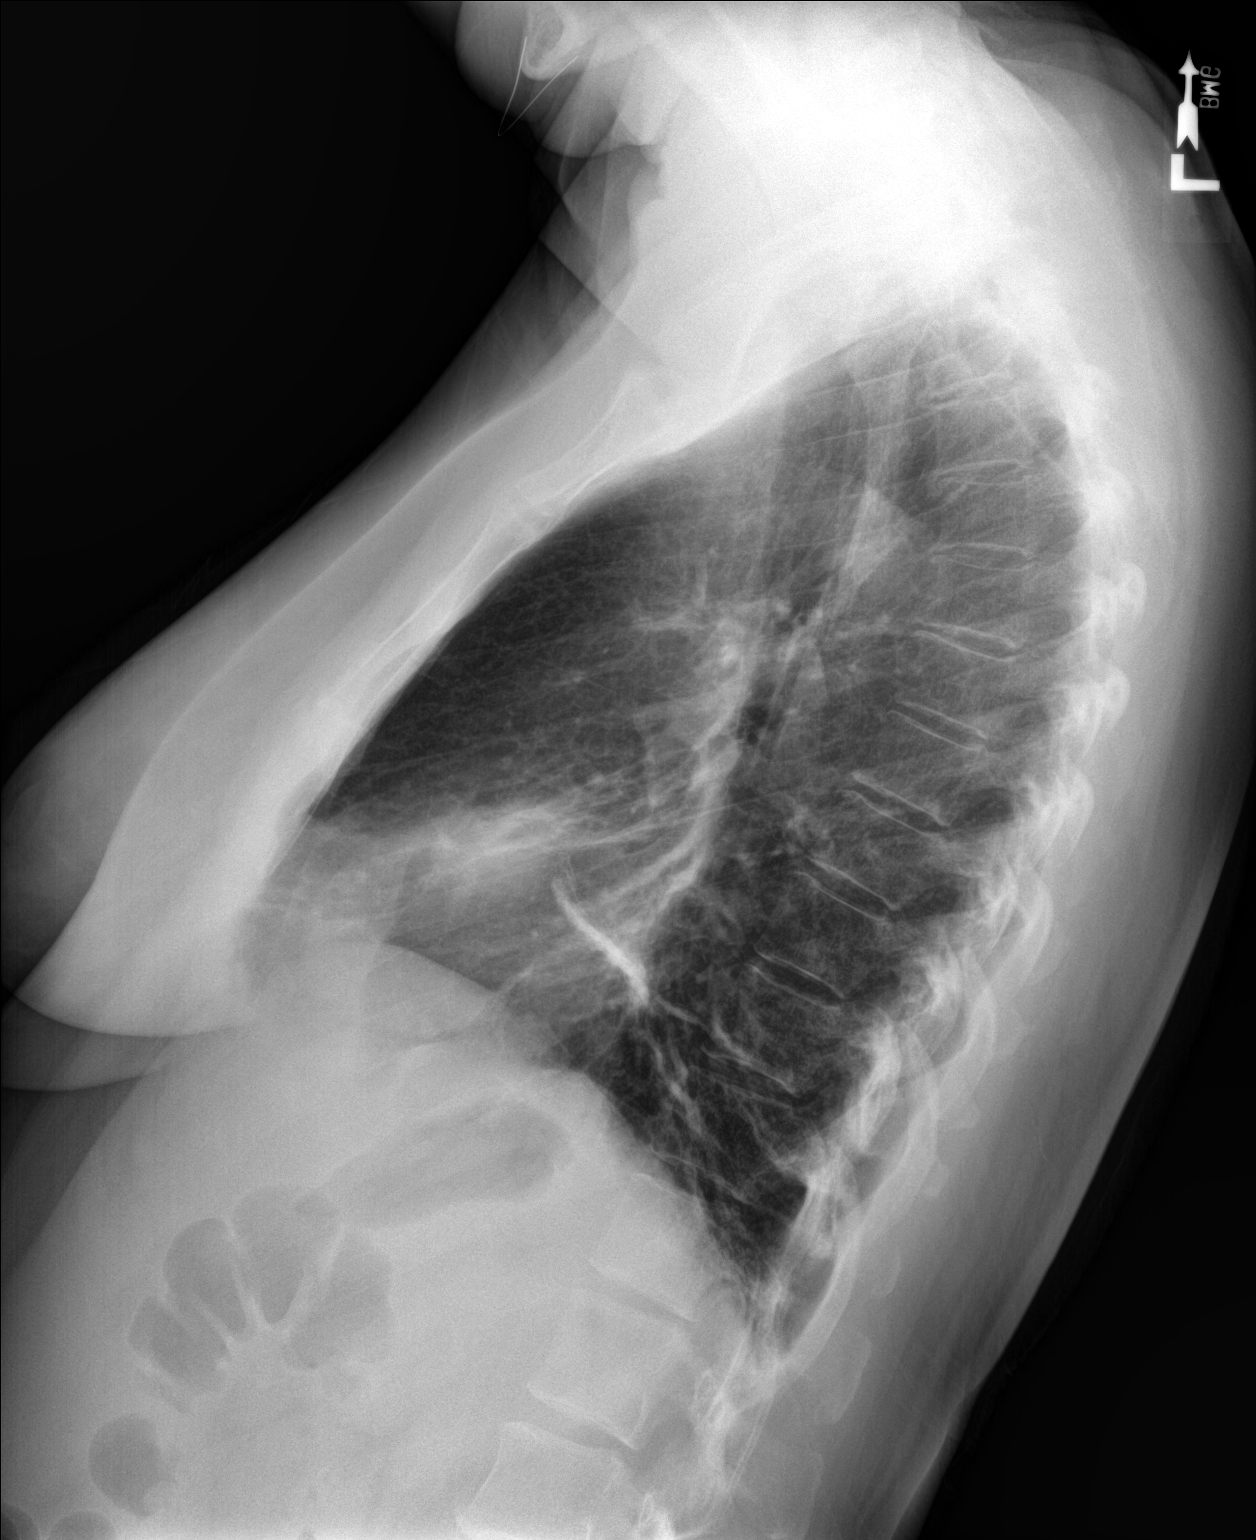

[2 of 2 positions shown; findings below may reference images not displayed]

FINDINGS: The cardiac silhouette, mediastinal and hilar contours are within
normal limits. There is mild tortuosity of the thoracic aorta.
Patchy bibasilar airspace opacities likely a combination of
infiltrates and atelectasis. No pleural effusion. No pulmonary
edema. The bony thorax is intact.
IMPRESSION: Patchy bibasilar infiltrates and atelectasis.

## 2018-06-01 ENCOUNTER — Telehealth: Payer: Self-pay | Admitting: Internal Medicine

## 2018-06-01 NOTE — Telephone Encounter (Signed)
Copied from CRM [id number]. Topic: Appointment Scheduling - Scheduling Inquiry for Clinic >> Jun 01, 2018 10:34 AM Windy Kalata, NT wrote: Reason for CRM: patient is wanting to transfer from Dr. Lawerance Bach to [facility] so that her and her husband have the same doctor.   Dr. Lawerance Bach okay with you? Vernona Rieger okay with you?

## 2018-06-01 NOTE — Telephone Encounter (Signed)
Ok with me 

## 2018-06-02 NOTE — Telephone Encounter (Signed)
Need to inform patient we can set up the transfer appointment with [facility]. Her VM is not set up yet - Unable to leave message. Please set up this appointment once patient returns call.

## 2018-06-02 NOTE — Telephone Encounter (Signed)
Okay; thanks.

## 2018-09-24 ENCOUNTER — Encounter: Payer: Self-pay | Admitting: Physician Assistant

## 2018-09-24 ENCOUNTER — Ambulatory Visit: Payer: BC Managed Care – PPO | Admitting: Physician Assistant

## 2018-09-24 DIAGNOSIS — F319 Bipolar disorder, unspecified: Secondary | ICD-10-CM | POA: Diagnosis not present

## 2018-09-24 DIAGNOSIS — F411 Generalized anxiety disorder: Secondary | ICD-10-CM

## 2018-09-24 DIAGNOSIS — G4700 Insomnia, unspecified: Secondary | ICD-10-CM | POA: Diagnosis not present

## 2018-09-24 DIAGNOSIS — Z79899 Other long term (current) drug therapy: Secondary | ICD-10-CM | POA: Diagnosis not present

## 2018-09-24 MED ORDER — LAMOTRIGINE 150 MG PO TABS: 300.0000 mg | ORAL_TABLET | Freq: Every day | ORAL | 1 refills | Status: DC

## 2018-09-24 MED ORDER — ZOLPIDEM TARTRATE 10 MG PO TABS: 10.0000 mg | ORAL_TABLET | Freq: Every evening | ORAL | 0 refills | Status: DC | PRN

## 2018-09-24 MED ORDER — ALPRAZOLAM 1 MG PO TABS: 0.5000 mg | ORAL_TABLET | Freq: Two times a day (BID) | ORAL | 1 refills | Status: DC | PRN

## 2018-09-24 MED ORDER — QUETIAPINE FUMARATE 300 MG PO TABS: 300.0000 mg | ORAL_TABLET | Freq: Every day | ORAL | 1 refills | Status: DC

## 2018-09-24 NOTE — Progress Notes (Signed)
[facility]  Patient ID: Beverly Rowe,  MRN: [id number]  PCP: Pincus Sanes, MD  Date of Evaluation: 09/24/2018 Time spent:15 minutes  Chief Complaint:  Chief Complaint    Follow-up      HISTORY/CURRENT STATUS: HPI Here for 6 month med check.   States she is doing really well.  Medicines are working well.  No big mood swings.  Work is going fine.  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  Sleeps good for the most part.  She does have to take Ambien on occasion.  Patient denies loss of interest in usual activities and is able to enjoy things.  Denies decreased energy or motivation.  Appetite has not changed.  No extreme sadness, tearfulness, or feelings of hopelessness.  Denies any changes in concentration, making decisions or remembering things.  Denies suicidal or homicidal thoughts.  Patient denies increased energy with decreased need for sleep, no increased talkativeness, no racing thoughts, no impulsivity or risky behaviors, no increased spending, no increased libido, no grandiosity.  Denies dizziness, syncope, seizures, numbness, tingling, tremor, tics, unsteady gait, slurred speech, confusion.   Denies muscle or joint pain, stiffness, or dystonia.  Individual Medical History/ Review of Systems: Changes? :No   Past medications for mental health diagnoses include: Xanax, Ambien, Lamictal, Seroquel, Valium, Latuda, Risperdal, Abilify, Depakote, Lexapro  Allergies: Patient has no known allergies.  Current Medications:  Current Outpatient Medications:  .  ALPRAZolam (XANAX) 1 MG tablet, Take 0.5 tablets (0.5 mg total) by mouth 2 (two) times daily as needed for anxiety., Disp: 180 tablet, Rfl: 1 .  famotidine (PEPCID) 10 MG tablet, Take 10 mg by mouth 2 (two) times daily as needed for heartburn or indigestion., Disp: , Rfl:  .  lamoTRIgine (LAMICTAL) 150 MG tablet, Take 2 tablets (300 mg total) by mouth daily., Disp: 180 tablet, Rfl: 1 .  QUEtiapine  (SEROQUEL) 300 MG tablet, Take 1 tablet (300 mg total) by mouth at bedtime., Disp: 90 tablet, Rfl: 1 .  zolpidem (AMBIEN) 10 MG tablet, Take 1 tablet (10 mg total) by mouth at bedtime as needed for sleep., Disp: 90 tablet, Rfl: 0 .  cyclobenzaprine (FLEXERIL) 10 MG tablet, Take 1 tablet (10 mg total) by mouth 3 (three) times daily as needed for muscle spasms. (Patient not taking: Reported on 09/24/2018), Disp: 30 tablet, Rfl: 0 .  diclofenac (VOLTAREN) 75 MG EC tablet, Take 1 tablet (75 mg total) by mouth 2 (two) times daily. (Patient not taking: Reported on 09/24/2018), Disp: 30 tablet, Rfl: 0 .  glucosamine-chondroitin (MAX GLUCOSAMINE CHONDROITIN) 500-400 MG tablet, Take 1 tablet by mouth 3 (three) times daily. (Patient not taking: Reported on 09/24/2018), Disp: , Rfl:  .  omeprazole (PRILOSEC) 20 MG capsule, Take 1 capsule (20 mg total) by mouth daily. (Patient not taking: Reported on 09/24/2018), Disp: 30 capsule, Rfl: 5 .  ranitidine (ZANTAC) 150 MG tablet, Take 1 tablet (150 mg total) by mouth at bedtime. (Patient not taking: Reported on 09/24/2018), Disp: , Rfl:  Medication Side Effects: none  Family Medical/ Social History: Changes? No  MENTAL HEALTH EXAM:  There were no vitals taken for this visit.There is no height or weight on file to calculate BMI.  General Appearance: Casual and Well Groomed  Eye Contact:  Good  Speech:  Clear and Coherent  Volume:  Normal  Mood:  Euthymic  Affect:  Appropriate  Thought Process:  Goal Directed  Orientation:  Full (Time, Place, and Person)  Thought Content:  Logical   Suicidal Thoughts:  No  Homicidal Thoughts:  No  Memory:  WNL  Judgement:  Good  Insight:  Good  Psychomotor Activity:  Normal  Concentration:  Concentration: Good  Recall:  Good  Fund of Knowledge: Good  Language: Good  Assets:  Desire for Improvement  ADL's:  Intact  Cognition: WNL  Prognosis:  Good    DIAGNOSES:    ICD-10-CM   1. Bipolar I disorder (HCC) F31.9   2.  Generalized anxiety disorder F41.1   3. Insomnia, unspecified type G47.00   4. Encounter for long-term (current) use of medications Z79.899 Basic metabolic panel    Hemoglobin A1c    Lipid panel    Receiving Psychotherapy: No    RECOMMENDATIONS: PDMP reviewed. I am glad to see her doing so well!. Continue Xanax 1 mg half to 1 twice daily as needed. Continue Lamictal 150 mg 2 nightly. Continue Seroquel 300 mg nightly. Continue Ambien 10 mg nightly as needed. I have stressed the importance once again of having the labs done.  She understands the risk of increased lipid panel as well as blood sugar.  She states that she will have them done at this time. Return in 6 months or sooner as needed.  Melony Overly, PA-C

## 2018-11-18 ENCOUNTER — Other Ambulatory Visit: Payer: Self-pay | Admitting: Physician Assistant

## 2018-11-18 ENCOUNTER — Telehealth: Payer: Self-pay | Admitting: Physician Assistant

## 2018-11-18 MED ORDER — BUSPIRONE HCL 15 MG PO TABS: ORAL_TABLET | ORAL | 0 refills | Status: DC

## 2018-11-18 MED ORDER — ALPRAZOLAM 1 MG PO TABS: ORAL_TABLET | ORAL | 0 refills | Status: DC

## 2018-11-18 NOTE — Progress Notes (Signed)
Patient called requesting early refill on Xanax.  Has been extremely anxious because of the coronavirus pandemic.  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  "My nerves are shot.  I have gone up from 2 pills a day on the Xanax to 2-1/2 pills a day and then now I am up to 3.  It helps but I am still anxious and having panic attacks every day and I just feel uneasy all the time."  We discussed different options.  She cannot stay at 3 mg a day on the Xanax, and should have call the office prior to increasing the dose.    I recommend that we add BuSpar, which she is agreeable to do. I have sent in a prescription for 15 mg, one third twice daily for 7 days, two thirds twice daily for 7 days, then 1 p.o. twice daily.   I will give her a one-month supply of Xanax 1 mg at no more than 2.5 pills/day.   We discussed other ways to help with the anxiety.  Return in 4 weeks.

## 2018-11-18 NOTE — Telephone Encounter (Signed)
I spoke w/ pt. see prescription sent in.

## 2018-11-18 NOTE — Telephone Encounter (Signed)
Marcelino Duster called to request refill on her Xanax.  IT is not due until 5/8 but needs early refill because since she has been at home she has needed to take extra so she is out early.  Please send in approval for early refill to [facility].  Next Appt 03/25/19.

## 2018-12-18 ENCOUNTER — Other Ambulatory Visit: Payer: Self-pay | Admitting: Physician Assistant

## 2019-03-22 ENCOUNTER — Other Ambulatory Visit: Payer: Self-pay | Admitting: Physician Assistant

## 2019-03-25 ENCOUNTER — Other Ambulatory Visit: Payer: Self-pay

## 2019-03-25 ENCOUNTER — Ambulatory Visit (INDEPENDENT_AMBULATORY_CARE_PROVIDER_SITE_OTHER): Payer: BC Managed Care – PPO | Admitting: Physician Assistant

## 2019-03-25 ENCOUNTER — Encounter: Payer: Self-pay | Admitting: Physician Assistant

## 2019-03-25 DIAGNOSIS — F411 Generalized anxiety disorder: Secondary | ICD-10-CM

## 2019-03-25 DIAGNOSIS — F319 Bipolar disorder, unspecified: Secondary | ICD-10-CM | POA: Diagnosis not present

## 2019-03-25 DIAGNOSIS — G4700 Insomnia, unspecified: Secondary | ICD-10-CM | POA: Diagnosis not present

## 2019-03-25 MED ORDER — BUSPIRONE HCL 15 MG PO TABS: ORAL_TABLET | ORAL | 1 refills | Status: DC

## 2019-03-25 MED ORDER — QUETIAPINE FUMARATE 300 MG PO TABS: 300.0000 mg | ORAL_TABLET | Freq: Every day | ORAL | 1 refills | Status: DC

## 2019-03-25 MED ORDER — LAMOTRIGINE 150 MG PO TABS: 300.0000 mg | ORAL_TABLET | Freq: Every day | ORAL | 1 refills | Status: DC

## 2019-03-25 MED ORDER — ALPRAZOLAM 1 MG PO TABS: 0.5000 mg | ORAL_TABLET | Freq: Two times a day (BID) | ORAL | 1 refills | Status: DC | PRN

## 2019-03-25 MED ORDER — ZOLPIDEM TARTRATE 10 MG PO TABS: 10.0000 mg | ORAL_TABLET | Freq: Every evening | ORAL | 1 refills | Status: DC | PRN

## 2019-03-25 NOTE — Progress Notes (Signed)
[facility]  Patient ID: Beverly PastorBarbara M Standifer,  MRN: [id number]  PCP: Pincus SanesBurns, Stacy J, MD  Date of Evaluation: 03/25/2019 Time spent:15 minutes  Chief Complaint:  Chief Complaint    Follow-up     Virtual Visit via Telephone Note  I connected with patient by a video enabled telemedicine application or telephone, with their informed consent, and verified patient privacy and that I am speaking with the correct person using two identifiers.  I am private, in my home and the patient is home.  I discussed the limitations, risks, security and privacy concerns of performing an evaluation and management service by telephone and the availability of in person appointments. I also discussed with the patient that there may be a patient responsible charge related to this service. The patient expressed understanding and agreed to proceed.   I discussed the assessment and treatment plan with the patient. The patient was provided an opportunity to ask questions and all were answered. The patient agreed with the plan and demonstrated an understanding of the instructions.   The patient was advised to call back or seek an in-person evaluation if the symptoms worsen or if the condition fails to improve as anticipated.  I provided 15 minutes of non-face-to-face time during this encounter.  HISTORY/CURRENT STATUS: HPI For 6 month med check.   Doing well overall. 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  She still has to go in to her school room to do those classes however.  She feels that her medicines are working well.  She is able to enjoy things.  Energy and motivation are good.  She is not isolating any more than is required due to the coronavirus pandemic.  She does not cry easily.  She sleeps well and does not take the Ambien every night.  It is helpful when she needs it though.  She denies suicidal or homicidal thoughts.  Denies increased energy with decreased need for  sleep.  No increased libido, increased spending, impulsivity, or risky behavior.  No grandiosity.  No talkativeness.  No hallucinations are reported.  Denies dizziness, syncope, seizures, numbness, tingling, tremor, tics, unsteady gait, slurred speech, confusion. Denies muscle or joint pain, stiffness, or dystonia.  Individual Medical History/ Review of Systems: Changes? :No    Past medications for mental health diagnoses include: Xanax, Ambien, Lamictal, Seroquel, Valium, Latuda, Risperdal, Abilify, Depakote, Lexapro  Allergies: Patient has no known allergies.  Current Medications:  Current Outpatient Medications:  .  ALPRAZolam (XANAX) 1 MG tablet, Take 0.5-1 tablets (0.5-1 mg total) by mouth 2 (two) times daily as needed for anxiety., Disp: 180 tablet, Rfl: 1 .  busPIRone (BUSPAR) 15 MG tablet, TAKE 1 TABLET BY MOUTH TWICE A DAY, Disp: 180 tablet, Rfl: 1 .  famotidine (PEPCID) 10 MG tablet, Take 10 mg by mouth 2 (two) times daily as needed for heartburn or indigestion., Disp: , Rfl:  .  glucosamine-chondroitin (MAX GLUCOSAMINE CHONDROITIN) 500-400 MG tablet, Take 1 tablet by mouth 3 (three) times daily., Disp: , Rfl:  .  lamoTRIgine (LAMICTAL) 150 MG tablet, Take 2 tablets (300 mg total) by mouth daily., Disp: 180 tablet, Rfl: 1 .  QUEtiapine (SEROQUEL) 300 MG tablet, Take 1 tablet (300 mg total) by mouth at bedtime., Disp: 90 tablet, Rfl: 1 .  zolpidem (AMBIEN) 10 MG tablet, Take 1 tablet (10 mg total) by mouth at bedtime as needed for sleep., Disp: 90 tablet, Rfl: 1 .  cyclobenzaprine (FLEXERIL) 10 MG tablet, Take 1 tablet (  10 mg total) by mouth 3 (three) times daily as needed for muscle spasms. (Patient not taking: Reported on 09/24/2018), Disp: 30 tablet, Rfl: 0 .  diclofenac (VOLTAREN) 75 MG EC tablet, Take 1 tablet (75 mg total) by mouth 2 (two) times daily. (Patient not taking: Reported on 09/24/2018), Disp: 30 tablet, Rfl: 0 .  omeprazole (PRILOSEC) 20 MG capsule, Take 1 capsule (20 mg  total) by mouth daily. (Patient not taking: Reported on 09/24/2018), Disp: 30 capsule, Rfl: 5 .  ranitidine (ZANTAC) 150 MG tablet, Take 1 tablet (150 mg total) by mouth at bedtime. 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  General Appearance: unable to assess  Eye Contact:  unable to assess  Speech:  Clear and Coherent  Volume:  Normal  Mood:  Euthymic  Affect:  unable to assess  Thought Process:  Goal Directed  Orientation:  Full (Time, Place, and Person)  Thought Content: Logical   Suicidal Thoughts:  No  Homicidal Thoughts:  No  Memory:  WNL  Judgement:  Good  Insight:  Good  Psychomotor Activity:  unable to assess  Concentration:  Concentration: Good  Recall:  Good  Fund of Knowledge: Good  Language: Good  Assets:  Desire for Improvement  ADL's:  Intact  Cognition: WNL  Prognosis:  Good    DIAGNOSES:    ICD-10-CM   1. Bipolar I disorder ([place])  F31.9   2. Generalized anxiety disorder  F41.1   3. Insomnia, unspecified type  G47.00     Receiving Psychotherapy: No    RECOMMENDATIONS:  Continue Xanax 1 mg 1 p.o. twice daily PRN.  PDMP was reviewed. Continue BuSpar 15 mg 1 twice daily. Continue Lamictal 150 mg, 2 p.o. nightly. Continue Seroquel 300 mg nightly. Continue Ambien 10 mg nightly as needed. Stressed importance of having labs drawn.  She has ordered from the last visit, which is still good. Return in 6 months.  Donnal Moat, PA-C   This record has been created using [employer].  Chart creation errors have been sought, but may not always have been located and corrected. Such creation errors do not reflect on the standard of medical care.

## 2019-10-19 ENCOUNTER — Other Ambulatory Visit: Payer: Self-pay

## 2019-10-19 ENCOUNTER — Ambulatory Visit: Payer: BC Managed Care – PPO | Admitting: Physician Assistant

## 2019-10-19 ENCOUNTER — Telehealth: Payer: Self-pay | Admitting: Physician Assistant

## 2019-10-19 MED ORDER — BUSPIRONE HCL 15 MG PO TABS: ORAL_TABLET | ORAL | 0 refills | Status: DC

## 2019-10-19 MED ORDER — LAMOTRIGINE 150 MG PO TABS: 300.0000 mg | ORAL_TABLET | Freq: Every day | ORAL | 0 refills | Status: DC

## 2019-10-19 MED ORDER — QUETIAPINE FUMARATE 300 MG PO TABS: 300.0000 mg | ORAL_TABLET | Freq: Every day | ORAL | 0 refills | Status: DC

## 2019-10-19 NOTE — Telephone Encounter (Signed)
Beverly Rowe had to cancel pt's appt today. Pt will need all of her Psych Meds [facility]

## 2019-10-20 MED ORDER — ZOLPIDEM TARTRATE 10 MG PO TABS: 10.0000 mg | ORAL_TABLET | Freq: Every evening | ORAL | 0 refills | Status: DC | PRN

## 2019-10-20 NOTE — Telephone Encounter (Signed)
noted 

## 2019-11-09 ENCOUNTER — Ambulatory Visit (INDEPENDENT_AMBULATORY_CARE_PROVIDER_SITE_OTHER): Payer: BC Managed Care – PPO | Admitting: Physician Assistant

## 2019-11-09 ENCOUNTER — Encounter: Payer: Self-pay | Admitting: Physician Assistant

## 2019-11-09 ENCOUNTER — Ambulatory Visit: Payer: BC Managed Care – PPO | Admitting: Physician Assistant

## 2019-11-09 DIAGNOSIS — F319 Bipolar disorder, unspecified: Secondary | ICD-10-CM

## 2019-11-09 DIAGNOSIS — G4700 Insomnia, unspecified: Secondary | ICD-10-CM | POA: Diagnosis not present

## 2019-11-09 DIAGNOSIS — F411 Generalized anxiety disorder: Secondary | ICD-10-CM | POA: Diagnosis not present

## 2019-11-09 DIAGNOSIS — Z79899 Other long term (current) drug therapy: Secondary | ICD-10-CM

## 2019-11-09 MED ORDER — ZOLPIDEM TARTRATE 10 MG PO TABS: 10.0000 mg | ORAL_TABLET | Freq: Every evening | ORAL | 1 refills | Status: DC | PRN

## 2019-11-09 MED ORDER — ALPRAZOLAM 1 MG PO TABS: 0.5000 mg | ORAL_TABLET | Freq: Two times a day (BID) | ORAL | 1 refills | Status: DC | PRN

## 2019-11-09 MED ORDER — QUETIAPINE FUMARATE 300 MG PO TABS: 300.0000 mg | ORAL_TABLET | Freq: Every day | ORAL | 1 refills | Status: DC

## 2019-11-09 MED ORDER — BUSPIRONE HCL 15 MG PO TABS: ORAL_TABLET | ORAL | 1 refills | Status: DC

## 2019-11-09 MED ORDER — LAMOTRIGINE 150 MG PO TABS: 300.0000 mg | ORAL_TABLET | Freq: Every day | ORAL | 1 refills | Status: DC

## 2019-11-09 NOTE — Progress Notes (Signed)
[facility]  Patient ID: Beverly Rowe,  MRN: [id number]  PCP: Pincus Sanes, MD  Date of Evaluation: 11/09/2019 Time spent:20 minutes  Chief Complaint:  Chief Complaint    Anxiety; Depression; Insomnia     Virtual Visit via Telephone Note  I connected with patient by a video enabled telemedicine application or telephone, with their informed consent, and verified patient privacy and that I am speaking with the correct person using two identifiers.  I am private, in my office and the patient is home.  I discussed the limitations, risks, security and privacy concerns of performing an evaluation and management service by telephone and the availability of in person appointments. I also discussed with the patient that there may be a patient responsible charge related to this service. The patient expressed understanding and agreed to proceed.   I discussed the assessment and treatment plan with the patient. The patient was provided an opportunity to ask questions and all were answered. The patient agreed with the plan and demonstrated an understanding of the instructions.   The patient was advised to call back or seek an in-person evaluation if the symptoms worsen or if the condition fails to improve as anticipated.  I provided 20 minutes of non-face-to-face time during this encounter.  HISTORY/CURRENT STATUS: HPI For routine med check.  Doing really well. Moods are stable.  Feels like the medicines are still working well.  She still has not gotten her labs done which were ordered last February.  She is able to enjoy things when she has the chance.  Energy and motivation are good.  Not isolating.  No easy crying.  She sleeps well most of the time but does need the Ambien.  She feels rested when she gets up.  Work is going well.  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  Denies suicidal or homicidal thoughts.  Patient denies increased energy  with decreased need for sleep, no increased talkativeness, no racing thoughts, no impulsivity or risky behaviors, no increased spending, no increased libido, no grandiosity.  No increased irritability.  No hallucinations.  Continues to have generalized anxiety throughout the day if she does not take the Xanax.  She takes it on a daily basis however and it is effective.  Denies dizziness, syncope, seizures, numbness, tingling, tremor, tics, unsteady gait, slurred speech, confusion. Denies muscle or joint pain, stiffness, or dystonia.  Individual Medical History/ Review of Systems: Changes? :No   Allergies: Patient has no known allergies.  Current Medications:  Current Outpatient Medications:  .  ALPRAZolam (XANAX) 1 MG tablet, Take 0.5-1 tablets (0.5-1 mg total) by mouth 2 (two) times daily as needed for anxiety., Disp: 180 tablet, Rfl: 1 .  busPIRone (BUSPAR) 15 MG tablet, TAKE 1 TABLET BY MOUTH TWICE A DAY, Disp: 180 tablet, Rfl: 1 .  famotidine (PEPCID) 10 MG tablet, Take 10 mg by mouth 2 (two) times daily as needed for heartburn or indigestion., Disp: , Rfl:  .  glucosamine-chondroitin (MAX GLUCOSAMINE CHONDROITIN) 500-400 MG tablet, Take 1 tablet by mouth 3 (three) times daily., Disp: , Rfl:  .  lamoTRIgine (LAMICTAL) 150 MG tablet, Take 2 tablets (300 mg total) by mouth daily., Disp: 180 tablet, Rfl: 1 .  QUEtiapine (SEROQUEL) 300 MG tablet, Take 1 tablet (300 mg total) by mouth at bedtime., Disp: 90 tablet, Rfl: 1 .  Rhubarb (ESTROVEN COMPLETE PO), Take by mouth., Disp: , Rfl:  .  vitamin E (VITAMIN  E) 180 MG (400 UNITS) capsule, Take 400 Units by mouth daily., Disp: , Rfl:  .  zolpidem (AMBIEN) 10 MG tablet, Take 1 tablet (10 mg total) by mouth at bedtime as needed for sleep., Disp: 90 tablet, Rfl: 1 .  cyclobenzaprine (FLEXERIL) 10 MG tablet, Take 1 tablet (10 mg total) by mouth 3 (three) times daily as needed for muscle spasms. (Patient not taking: Reported on 09/24/2018), Disp: 30  tablet, Rfl: 0 .  diclofenac (VOLTAREN) 75 MG EC tablet, Take 1 tablet (75 mg total) by mouth 2 (two) times daily. (Patient not taking: Reported on 09/24/2018), Disp: 30 tablet, Rfl: 0 .  omeprazole (PRILOSEC) 20 MG capsule, Take 1 capsule (20 mg total) by mouth daily. (Patient not taking: Reported on 09/24/2018), Disp: 30 capsule, Rfl: 5 .  ranitidine (ZANTAC) 150 MG tablet, Take 1 tablet (150 mg total) by mouth at bedtime. (Patient not taking: Reported on 09/24/2018), Disp: , Rfl:  Medication Side Effects: none  Family Medical/ Social History: Changes? No  MENTAL HEALTH EXAM:  There were no vitals taken for this visit.There is no height or weight on file to calculate BMI.  General Appearance: unable to assess  Eye Contact:  Good  Speech:  Clear and Coherent  Volume:  Normal  Mood:  Euthymic  Affect:  Appropriate  Thought Process:  Goal Directed and Descriptions of Associations: Intact  Orientation:  Full (Time, Place, and Person)  Thought Content: Logical   Suicidal Thoughts:  No  Homicidal Thoughts:  No  Memory:  WNL  Judgement:  Good  Insight:  Good  Psychomotor Activity:  unable to assess  Concentration:  Concentration: Good  Recall:  Good  Fund of Knowledge: Good  Language: Good  Assets:  Desire for Improvement  ADL's:  Intact  Cognition: WNL  Prognosis:  Good    DIAGNOSES:    ICD-10-CM   1. Bipolar I disorder ([place])  F31.9   2. Encounter for long-term (current) use of medications  Z79.899 Lipid panel    Basic metabolic panel    Hemoglobin A1c  3. Generalized anxiety disorder  F41.1   4. Insomnia, unspecified type  G47.00     Receiving Psychotherapy: No    RECOMMENDATIONS:  PDMP was reviewed. I spent 20 minutes with her. I am glad to see her doing so well!  I am ordering BMP, hemoglobin A1c, and fasting lipids once again.  I have reminded her of the importance of having these labs done.  We discussed the fact that atypical antipsychotics can increase glucose as  well as lipids and we really need to follow that.  She understands.  Continue Xanax 1 mg, 1/2-1 p.o. twice daily as needed. Continue BuSpar 15 mg, 1 p.o. twice daily. Continue Lamictal 150 mg, 2 p.o. nightly. Continue Seroquel 300 mg, 1 p.o. nightly. Continue Ambien 10 mg, 1 p.o. nightly as needed. Labs ordered as above. Return in 6 months.  Donnal Moat, PA-C

## 2020-01-02 ENCOUNTER — Emergency Department (HOSPITAL_COMMUNITY)
Admission: EM | Admit: 2020-01-02 | Discharge: 2020-01-02 | Disposition: A | Discharge status: Home / Self Care | Payer: BC Managed Care – PPO

## 2020-07-04 ENCOUNTER — Other Ambulatory Visit: Payer: Self-pay | Admitting: Physician Assistant

## 2020-07-08 NOTE — Telephone Encounter (Signed)
Has apt Monday 11/29

## 2020-07-09 ENCOUNTER — Encounter: Payer: Self-pay | Admitting: Physician Assistant

## 2020-07-09 ENCOUNTER — Ambulatory Visit (INDEPENDENT_AMBULATORY_CARE_PROVIDER_SITE_OTHER): Payer: BC Managed Care – PPO | Admitting: Physician Assistant

## 2020-07-09 ENCOUNTER — Other Ambulatory Visit: Payer: Self-pay

## 2020-07-09 DIAGNOSIS — F319 Bipolar disorder, unspecified: Secondary | ICD-10-CM

## 2020-07-09 DIAGNOSIS — F341 Dysthymic disorder: Secondary | ICD-10-CM | POA: Diagnosis not present

## 2020-07-09 DIAGNOSIS — G4700 Insomnia, unspecified: Secondary | ICD-10-CM | POA: Diagnosis not present

## 2020-07-09 DIAGNOSIS — F411 Generalized anxiety disorder: Secondary | ICD-10-CM

## 2020-07-09 MED ORDER — QUETIAPINE FUMARATE 300 MG PO TABS: 300.0000 mg | ORAL_TABLET | Freq: Every day | ORAL | 1 refills | Status: DC

## 2020-07-09 MED ORDER — ZOLPIDEM TARTRATE 10 MG PO TABS: 10.0000 mg | ORAL_TABLET | Freq: Every evening | ORAL | 1 refills | Status: DC | PRN

## 2020-07-09 MED ORDER — BUSPIRONE HCL 15 MG PO TABS: ORAL_TABLET | ORAL | 1 refills | Status: DC

## 2020-07-09 MED ORDER — ALPRAZOLAM 1 MG PO TABS: 0.5000 mg | ORAL_TABLET | Freq: Two times a day (BID) | ORAL | 1 refills | Status: DC | PRN

## 2020-07-09 NOTE — Progress Notes (Signed)
[facility]  Patient ID: Beverly Rowe,  MRN: [id number]  PCP: Pincus Sanes, MD  Date of Evaluation: 07/09/2020 Time spent:30 minutes  Chief Complaint:  Chief Complaint    Anxiety; Depression; Insomnia      HISTORY/CURRENT STATUS: HPI For routine med check.  She and her husband are separated, since January. "By my choosing. I tried to make it work but it didn't. So I'm happy."  Feels like the medicines are still working well.  She is able to enjoy things. Energy and motivation are good.  Not isolating.  No easy crying.  She sleeps well most of the time but does need the Ambien.  She feels rested when she gets up.  Work is going well.  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 Anxiety is well controlled. The Xanax is still very helpful. She needs it on a daily basis. Denies suicidal or homicidal thoughts.  Patient denies increased energy with decreased need for sleep, no increased talkativeness, no racing thoughts, no impulsivity or risky behaviors, no increased spending, no increased libido, no grandiosity.  No increased irritability.  No hallucinations.  Denies dizziness, syncope, seizures, numbness, tingling, tremor, tics, unsteady gait, slurred speech, confusion. Denies muscle or joint pain, stiffness, or dystonia.  Individual Medical History/ Review of Systems: Changes? :No    Past medications for mental health diagnoses include: Xanax, Ambien, Lamictal, Seroquel, Valium, Latuda, Risperdal, Abilify, Depakote, Lexapro  Allergies: Patient has no known allergies.  Current Medications:  Current Outpatient Medications:  .  ALPRAZolam (XANAX) 1 MG tablet, Take 0.5-1 tablets (0.5-1 mg total) by mouth 2 (two) times daily as needed for anxiety., Disp: 180 tablet, Rfl: 1 .  busPIRone (BUSPAR) 15 MG tablet, TAKE 1 TABLET BY MOUTH TWICE A DAY, Disp: 180 tablet, Rfl: 1 .  famotidine (PEPCID) 10 MG tablet, Take 10 mg by mouth 2 (two) times daily  as needed for heartburn or indigestion., Disp: , Rfl:  .  QUEtiapine (SEROQUEL) 300 MG tablet, Take 1 tablet (300 mg total) by mouth at bedtime., Disp: 90 tablet, Rfl: 1 .  Rhubarb (ESTROVEN COMPLETE PO), Take by mouth., Disp: , Rfl:  .  vitamin E (VITAMIN E) 180 MG (400 UNITS) capsule, Take 400 Units by mouth daily., Disp: , Rfl:  .  zolpidem (AMBIEN) 10 MG tablet, Take 1 tablet (10 mg total) by mouth at bedtime as needed for sleep., Disp: 90 tablet, Rfl: 1 .  glucosamine-chondroitin (MAX GLUCOSAMINE CHONDROITIN) 500-400 MG tablet, Take 1 tablet by mouth 3 (three) times daily. (Patient not taking: Reported on 07/09/2020), Disp: , Rfl:  .  lamoTRIgine (LAMICTAL) 150 MG tablet, TAKE 2 TABLETS (300 MG TOTAL) BY MOUTH DAILY., Disp: 180 tablet, Rfl: 1 .  omeprazole (PRILOSEC) 20 MG capsule, Take 1 capsule (20 mg total) by mouth daily. (Patient not taking: Reported on 09/24/2018), Disp: 30 capsule, Rfl: 5 .  ranitidine (ZANTAC) 150 MG tablet, Take 1 tablet (150 mg total) by mouth at bedtime. (Patient not taking: Reported on 09/24/2018), Disp: , Rfl:  Medication Side Effects: none  Family Medical/ Social History: Changes? No  MENTAL HEALTH EXAM:  There were no vitals taken for this visit.There is no height or weight on file to calculate BMI.  General Appearance: Casual, Neat and Well Groomed  Eye Contact:  Good  Speech:  Clear and Coherent  Volume:  Normal  Mood:  Euthymic  Affect:  Appropriate  Thought Process:  Goal Directed and Descriptions of Associations:  Intact  Orientation:  Full (Time, Place, and Person)  Thought Content: Logical   Suicidal Thoughts:  No  Homicidal Thoughts:  No  Memory:  WNL  Judgement:  Good  Insight:  Good  Psychomotor Activity:  Normal  Concentration:  Concentration: Good  Recall:  Good  Fund of Knowledge: Good  Language: Good  Assets:  Desire for Improvement  ADL's:  Intact  Cognition: WNL  Prognosis:  Good    DIAGNOSES:    ICD-10-CM   1. Bipolar I  disorder ([facility]CC)  F31.9   2. Generalized anxiety disorder  F41.1   3. Dysthymia  F34.1   4. Insomnia, unspecified type  G47.00     Receiving Psychotherapy: No    RECOMMENDATIONS:  PDMP was reviewed. I provided 30 minutes face-to-face time during this encounter. We again discussed her labs. I stressed the importance due to the fact that any of the atypical antipsychotics can lead to hyperlipidemia and possibly diabetes. She does not need to keep putting this off. I am ordering BMP, hemoglobin A1c, and fasting lipids once again. This time I hand wrote the order on a prescription pad for BMP, fasting lipid profile, and hemoglobin A1c. I'm glad to see she is doing well. She is happy about the pending divorce. continue Xanax 1 mg, 1/2-1 p.o. twice daily as needed. Continue BuSpar 15 mg, 1 p.o. twice daily. Continue Lamictal 150 mg, 2 p.o. nightly. Continue Seroquel 300 mg, 1 p.o. nightly. Continue Ambien 10 mg, 1 p.o. nightly as needed. Return in 6 months.  Melony Overly, PA-C

## 2021-01-02 ENCOUNTER — Ambulatory Visit (INDEPENDENT_AMBULATORY_CARE_PROVIDER_SITE_OTHER): Payer: BC Managed Care – PPO | Admitting: Physician Assistant

## 2021-01-02 ENCOUNTER — Other Ambulatory Visit: Payer: Self-pay

## 2021-01-02 ENCOUNTER — Encounter: Payer: Self-pay | Admitting: Physician Assistant

## 2021-01-02 DIAGNOSIS — F341 Dysthymic disorder: Secondary | ICD-10-CM

## 2021-01-02 DIAGNOSIS — G4700 Insomnia, unspecified: Secondary | ICD-10-CM | POA: Diagnosis not present

## 2021-01-02 DIAGNOSIS — F411 Generalized anxiety disorder: Secondary | ICD-10-CM | POA: Diagnosis not present

## 2021-01-02 DIAGNOSIS — Z79899 Other long term (current) drug therapy: Secondary | ICD-10-CM

## 2021-01-02 DIAGNOSIS — F319 Bipolar disorder, unspecified: Secondary | ICD-10-CM | POA: Diagnosis not present

## 2021-01-02 DIAGNOSIS — E6609 Other obesity due to excess calories: Secondary | ICD-10-CM

## 2021-01-02 MED ORDER — BUSPIRONE HCL 15 MG PO TABS: ORAL_TABLET | ORAL | 1 refills | Status: DC

## 2021-01-02 MED ORDER — ZOLPIDEM TARTRATE 10 MG PO TABS: 10.0000 mg | ORAL_TABLET | Freq: Every evening | ORAL | 1 refills | Status: DC | PRN

## 2021-01-02 MED ORDER — ALPRAZOLAM 1 MG PO TABS
0.5000 mg | ORAL_TABLET | Freq: Two times a day (BID) | ORAL | 1 refills | Status: DC | PRN
Start: 2021-01-02 — End: 2021-07-08

## 2021-01-02 MED ORDER — QUETIAPINE FUMARATE 300 MG PO TABS: 300.0000 mg | ORAL_TABLET | Freq: Every day | ORAL | 1 refills | Status: DC

## 2021-01-02 MED ORDER — LAMOTRIGINE 150 MG PO TABS: 300.0000 mg | ORAL_TABLET | Freq: Every day | ORAL | 1 refills | Status: DC

## 2021-01-02 NOTE — Progress Notes (Signed)
[facility]  Patient ID: Beverly Rowe,  MRN: [id number]  PCP: Pincus Sanes, MD  Date of Evaluation: 01/02/2021 Time spent:40 minutes  Chief Complaint:  Chief Complaint    Anxiety; Depression; Insomnia; Follow-up      HISTORY/CURRENT STATUS: HPI For routine med check.  Didn't get labs done. "Again. I know I need to.  I keep forgetting."   Is a Runner, broadcasting/film/video. Looking forward to summer and plans to retire Oct 1st. Too stressful. Teachers blamed for everything, parents or kids aren't held accountable for anything.  States she is gained a lot of weight in the past year.  She and her husband have been separated for over a year now but it has been stressful because he refuses a divorce.  She is going to have to serve papers on him.  Patient denies loss of interest in usual activities and is able to enjoy things.  Denies decreased energy or motivation.  Appetite has not changed.  No extreme sadness, tearfulness, or feelings of hopelessness.  Denies any changes in concentration, making decisions or remembering things.  Denies suicidal or homicidal thoughts.  Sleeps good with Ambien.  Anxiety is controlled with the Xanax.  She does need it daily or else she has an extreme sense of unease and sometimes starts to feel panicky.  Patient denies increased energy with decreased need for sleep, no increased talkativeness, no racing thoughts, no impulsivity or risky behaviors, no increased spending, no increased libido, no grandiosity, no increased irritability or anger, and no hallucinations.  Denies dizziness, syncope, seizures, numbness, tingling, tremor, tics, unsteady gait, slurred speech, confusion. Denies muscle or joint pain, stiffness, or dystonia. Denies unexplained weight loss, frequent infections, or sores that heal slowly.  No polyphagia, polydipsia, or polyuria. Denies visual changes or paresthesias.   Individual Medical History/ Review of Systems: Changes? :No   Past  medications for mental health diagnoses include: Xanax, Ambien, Lamictal, Seroquel, Valium, Latuda, Risperdal, Abilify, Depakote, Lexapro  Allergies: Patient has no known allergies.  Current Medications:  Current Outpatient Medications:  .  famotidine (PEPCID) 10 MG tablet, Take 10 mg by mouth 2 (two) times daily as needed for heartburn or indigestion., Disp: , Rfl:  .  glucosamine-chondroitin (MAX GLUCOSAMINE CHONDROITIN) 500-400 MG tablet, Take 1 tablet by mouth 3 (three) times daily., Disp: , Rfl:  .  ALPRAZolam (XANAX) 1 MG tablet, Take 0.5-1 tablets (0.5-1 mg total) by mouth 2 (two) times daily as needed for anxiety., Disp: 180 tablet, Rfl: 1 .  busPIRone (BUSPAR) 15 MG tablet, TAKE 1 TABLET BY MOUTH TWICE A DAY, Disp: 180 tablet, Rfl: 1 .  lamoTRIgine (LAMICTAL) 150 MG tablet, Take 2 tablets (300 mg total) by mouth daily., Disp: 180 tablet, Rfl: 1 .  omeprazole (PRILOSEC) 20 MG capsule, Take 1 capsule (20 mg total) by mouth daily. (Patient not taking: No sig reported), Disp: 30 capsule, Rfl: 5 .  QUEtiapine (SEROQUEL) 300 MG tablet, Take 1 tablet (300 mg total) by mouth at bedtime., Disp: 90 tablet, Rfl: 1 .  ranitidine (ZANTAC) 150 MG tablet, Take 1 tablet (150 mg total) by mouth at bedtime. (Patient not taking: No sig reported), Disp: , Rfl:  .  Rhubarb (ESTROVEN COMPLETE PO), Take by mouth. (Patient not taking: Reported on 01/02/2021), Disp: , Rfl:  .  vitamin E 180 MG (400 UNITS) capsule, Take 400 Units by mouth daily. (Patient not taking: Reported on 01/02/2021), Disp: , Rfl:  .  zolpidem (AMBIEN) 10 MG tablet, Take 1 tablet (  10 mg total) by mouth at bedtime as needed for sleep., Disp: 90 tablet, Rfl: 1 Medication Side Effects: none  Family Medical/ Social History: Changes? Yes see above, divorce still pending  MENTAL HEALTH EXAM:  There were no vitals taken for this visit.There is no height or weight on file to calculate BMI.  General Appearance: Casual, Well Groomed and Obese   Eye Contact:  Good  Speech:  Clear and Coherent and Normal Rate  Volume:  Normal  Mood:  Euthymic  Affect:  Appropriate  Thought Process:  Goal Directed and Descriptions of Associations: Circumstantial  Orientation:  Full (Time, Place, and Person)  Thought Content: Logical   Suicidal Thoughts:  No  Homicidal Thoughts:  No  Memory:  Immediate;   Fair Recent;   Fair Remote;   Good  Judgement:  Good  Insight:  Good  Psychomotor Activity:  Normal  Concentration:  Concentration: Good  Recall:  Good  Fund of Knowledge: Good  Language: Good  Assets:  Desire for Improvement  ADL's:  Intact  Cognition: WNL  Prognosis:  Good    DIAGNOSES:    ICD-10-CM   1. Bipolar I disorder (HCC)  F31.9   2. Generalized anxiety disorder  F41.1   3. Dysthymia  F34.1   4. Insomnia, unspecified type  G47.00   5. Encounter for long-term (current) use of medications  Z79.899   6. Obesity due to excess calories without serious comorbidity, unspecified classification  E66.09     Receiving Psychotherapy: No    RECOMMENDATIONS:  PDMP was reviewed. I provided 40 minutes of face-to-face time during this encounter, including time spent before and after the visit in records review, medical decision making, and charting. Compliance for labs was discussed.  She understands the risk of diabetes and increased cholesterol/trig with Seroquel and accepts any consequences of not having labs drawn thus hindering early detection. Went over weight loss tips, healthy eating, walking, even Ozempic, ask her PCP about it. Continue Xanax 1 mg, 1/2-1 p.o. twice daily as needed. Continue BuSpar 15 mg, 1 p.o. twice daily. Continue Lamictal 150 mg, 1 p.o. twice daily. Continue Seroquel 300 mg, 1 p.o. nightly. Orders written on an old prescription pad for CBC with differential, CMP, hemoglobin A1c, and fasting lipid panel. Return in 6 months  Melony Overly, [place]

## 2021-02-19 ENCOUNTER — Other Ambulatory Visit: Payer: Self-pay | Admitting: Physician Assistant

## 2021-02-19 NOTE — Telephone Encounter (Signed)
Last filled 12/15/20

## 2021-02-21 NOTE — Telephone Encounter (Signed)
Pharmacy confirmed she does not need a new rx sent

## 2021-02-21 NOTE — Telephone Encounter (Signed)
On 01/02/21 I sent in a 90 day supply with one RF. She shouldn't need this filled. Even if she did, she would only need #30 or #90.  Please call her pharmacy to double check that they received the Rx from 01/02/2021

## 2021-02-22 NOTE — Telephone Encounter (Signed)
noted 

## 2021-06-27 ENCOUNTER — Other Ambulatory Visit: Payer: Self-pay | Admitting: Physician Assistant

## 2021-07-08 ENCOUNTER — Encounter: Payer: Self-pay | Admitting: Physician Assistant

## 2021-07-08 ENCOUNTER — Other Ambulatory Visit: Payer: Self-pay

## 2021-07-08 ENCOUNTER — Ambulatory Visit: Payer: BC Managed Care – PPO | Admitting: Physician Assistant

## 2021-07-08 DIAGNOSIS — G4700 Insomnia, unspecified: Secondary | ICD-10-CM | POA: Diagnosis not present

## 2021-07-08 DIAGNOSIS — F319 Bipolar disorder, unspecified: Secondary | ICD-10-CM

## 2021-07-08 DIAGNOSIS — F411 Generalized anxiety disorder: Secondary | ICD-10-CM | POA: Diagnosis not present

## 2021-07-08 MED ORDER — ZOLPIDEM TARTRATE 10 MG PO TABS: 10.0000 mg | ORAL_TABLET | Freq: Every evening | ORAL | 1 refills | Status: DC | PRN

## 2021-07-08 MED ORDER — LAMOTRIGINE 150 MG PO TABS: 300.0000 mg | ORAL_TABLET | Freq: Every day | ORAL | 1 refills | Status: DC

## 2021-07-08 MED ORDER — BUSPIRONE HCL 15 MG PO TABS: 15.0000 mg | ORAL_TABLET | Freq: Three times a day (TID) | ORAL | 1 refills | Status: DC

## 2021-07-08 MED ORDER — ALPRAZOLAM 1 MG PO TABS
0.5000 mg | ORAL_TABLET | Freq: Two times a day (BID) | ORAL | 1 refills | Status: DC | PRN
Start: 2021-07-08 — End: 2021-10-09

## 2021-07-08 MED ORDER — QUETIAPINE FUMARATE 300 MG PO TABS: 300.0000 mg | ORAL_TABLET | Freq: Every day | ORAL | 1 refills | Status: DC

## 2021-07-08 NOTE — Progress Notes (Signed)
[facility]  Patient ID: Beverly Rowe,  MRN: [id number]  PCP: Pincus Sanes, MD  Date of Evaluation: 07/08/2021 Time spent:20 minutes  Chief Complaint:  Chief Complaint   Anxiety; Depression; Insomnia; Follow-up     HISTORY/CURRENT STATUS: HPI For routine med check.  Doing well except having more anxiety. Hot flashes are bad, wake her up a lot.  Feels like she needs the Xanax more often.  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   Patient denies loss of interest in usual activities and is able to enjoy things.  Denies decreased energy or motivation.  Appetite has not changed.  No extreme sadness, tearfulness, or feelings of hopelessness.  She does have trouble remembering things sometimes but it is no worse than normal.  Like why she went into a room or what ever. Sleeps good with Ambien.  Denies suicidal or homicidal thoughts.  Patient denies increased energy with decreased need for sleep, no increased talkativeness, no racing thoughts, no impulsivity or risky behaviors, no increased spending, no increased libido, no grandiosity, no increased irritability or anger, and no hallucinations.  Denies dizziness, syncope, seizures, numbness, tingling, tremor, tics, unsteady gait, slurred speech, confusion. Denies muscle or joint pain, stiffness, or dystonia. Denies unexplained weight loss, frequent infections, or sores that heal slowly.  No polyphagia, polydipsia, or polyuria. Denies visual changes or paresthesias.   Individual Medical History/ Review of Systems: Changes? :Yes   had a D&C a few months ago, had a fibroid.   Past medications for mental health diagnoses include: Xanax, Ambien, Lamictal, Seroquel, Valium, Latuda, Risperdal, Abilify, Depakote, Lexapro  Allergies: Patient has no  known allergies.  Current Medications:  Current Outpatient Medications:    famotidine (PEPCID) 10 MG tablet, Take 10 mg by mouth 2 (two) times daily as needed for heartburn or indigestion., Disp: , Rfl:    glucosamine-chondroitin (MAX GLUCOSAMINE CHONDROITIN) 500-400 MG tablet, Take 1 tablet by mouth 3 (three) times daily., Disp: , Rfl:    ALPRAZolam (XANAX) 1 MG tablet, Take 0.5-1 tablets (0.5-1 mg total) by mouth 2 (two) times daily as needed for anxiety., Disp: 180 tablet, Rfl: 1   busPIRone (BUSPAR) 15 MG tablet, Take 1 tablet (15 mg total) by mouth 3 (three) times daily., Disp: 270 tablet, Rfl: 1   lamoTRIgine (LAMICTAL) 150 MG tablet, Take 2 tablets (300 mg total) by mouth daily., Disp: 180 tablet, Rfl: 1   omeprazole (PRILOSEC) 20 MG capsule, Take 1 capsule (20 mg total) by mouth daily. (Patient not taking: Reported on 09/24/2018), Disp: 30 capsule, Rfl: 5   QUEtiapine (SEROQUEL) 300 MG tablet, Take 1 tablet (300 mg total) by mouth at bedtime., Disp: 90 tablet, Rfl: 1   ranitidine (ZANTAC) 150 MG tablet, Take 1 tablet (150 mg total) by mouth at bedtime. (Patient not taking: Reported on 09/24/2018), Disp: , Rfl:    Rhubarb (ESTROVEN COMPLETE PO), Take by mouth. (Patient not taking: Reported on 01/02/2021), Disp: , Rfl:    vitamin E 180 MG (400 UNITS) capsule, Take 400 Units by mouth daily. (Patient not taking: Reported on 01/02/2021), Disp: , Rfl:    zolpidem (AMBIEN) 10 MG tablet, Take 1 tablet (10 mg total) by mouth at bedtime as needed for sleep., Disp: 90 tablet, Rfl: 1 Medication Side Effects: none  Family Medical/ Social History: Changes? Yes divorced.  MENTAL HEALTH EXAM:  There were no vitals taken for this visit.There is no height or weight on file to calculate BMI.  General Appearance: Casual, Well Groomed and Obese  Eye Contact:  Good  Speech:  Clear and Coherent and Normal Rate  Volume:  Normal  Mood:  Euthymic  Affect:  Appropriate  Thought Process:  Goal Directed and  Descriptions of Associations: Circumstantial  Orientation:  Full (Time, Place, and Person)  Thought Content: Logical   Suicidal Thoughts:  No  Homicidal Thoughts:  No  Memory:  Immediate;   Fair Recent;   Fair Remote;   Good  Judgement:  Good  Insight:  Good  Psychomotor Activity:  Normal  Concentration:  Concentration: Good and Attention Span: Good  Recall:  Good  Fund of Knowledge: Good  Language: Good  Assets:  Desire for Improvement  ADL's:  Intact  Cognition: WNL  Prognosis:  Good    DIAGNOSES:    ICD-10-CM   1. Bipolar I disorder (HCC)  F31.9     2. Generalized anxiety disorder  F41.1     3. Insomnia, unspecified type  G47.00        Receiving Psychotherapy: No    RECOMMENDATIONS:  PDMP was reviewed.  Ambien filled 06/27/2021. I provided 20 minutes of face to face time during this encounter, including time spent before and after the visit in records review, medical decision making, counseling pertinent to today's visit, and charting.  She is aware she should have labs drawn as requested numerous times.  By not having them drawn then we may not find metabolic syndrome early, if it happens. Recommend increasing BuSpar which should help with generalized anxiety, hopefully she will not need the Xanax as much. Continue Xanax 1 mg, 1/2-1 p.o. twice daily as needed. Increase Buspar 15 mg, 1 po tid. (Or 2 po q am and 1 po qhs.) Continue Lamictal 150 mg, 1 p.o. twice daily. Continue Seroquel 300 mg, 1 p.o. nightly. Continue Ambien 10 mg, 1 p.o. nightly as needed sleep.   Return in 3 months  Melony Overly, [place]

## 2021-08-16 ENCOUNTER — Other Ambulatory Visit: Payer: Self-pay | Admitting: Physician Assistant

## 2021-10-04 ENCOUNTER — Other Ambulatory Visit: Payer: Self-pay

## 2021-10-04 ENCOUNTER — Ambulatory Visit: Payer: BC Managed Care – PPO | Admitting: Podiatry

## 2021-10-04 ENCOUNTER — Encounter: Payer: Self-pay | Admitting: Podiatry

## 2021-10-04 DIAGNOSIS — L603 Nail dystrophy: Secondary | ICD-10-CM

## 2021-10-04 NOTE — Progress Notes (Signed)
Patient presents to the office with thickened toenails both big toes.  She says she injured her right big toenail which then fell off.  Her left big toenail is thickened at the tip of big toenail.  She presents for evaluation of these non-painful toenails.  General Appearance  Alert, conversant and in no acute stress.  Vascular  Dorsalis pedis and posterior tibial  pulses are palpable  bilaterally.  Capillary return is within normal limits  bilaterally. Temperature is within normal limits  bilaterally.  Neurologic  Senn-Weinstein monofilament wire test within normal limits  bilaterally. Muscle power within normal limits bilaterally.  Nails Thick disfigured discolored nails with subungual debris  hallux nails  bilaterally. No evidence of bacterial infection or drainage bilaterally.  Orthopedic  No limitations of motion  feet .  No crepitus or effusions noted.  No bony pathology or digital deformities noted.  Skin  normotropic skin with no porokeratosis noted bilaterally.  No signs of infections or ulcers noted.     Nail Dystrophy/  Onychomycosis  IE.  Debride hallux toenails with nail nipper.  Discussed nail pathology.  RTC prn.  Helane Gunther DPM

## 2021-10-09 ENCOUNTER — Encounter: Payer: Self-pay | Admitting: Physician Assistant

## 2021-10-09 ENCOUNTER — Other Ambulatory Visit: Payer: Self-pay

## 2021-10-09 ENCOUNTER — Ambulatory Visit (INDEPENDENT_AMBULATORY_CARE_PROVIDER_SITE_OTHER): Payer: BC Managed Care – PPO | Admitting: Physician Assistant

## 2021-10-09 DIAGNOSIS — G4700 Insomnia, unspecified: Secondary | ICD-10-CM

## 2021-10-09 DIAGNOSIS — F319 Bipolar disorder, unspecified: Secondary | ICD-10-CM | POA: Diagnosis not present

## 2021-10-09 DIAGNOSIS — F411 Generalized anxiety disorder: Secondary | ICD-10-CM

## 2021-10-09 DIAGNOSIS — F331 Major depressive disorder, recurrent, moderate: Secondary | ICD-10-CM | POA: Diagnosis not present

## 2021-10-09 MED ORDER — LAMOTRIGINE 200 MG PO TABS: 200.0000 mg | ORAL_TABLET | Freq: Two times a day (BID) | ORAL | 1 refills | Status: DC

## 2021-10-09 MED ORDER — ALPRAZOLAM 1 MG PO TABS: 0.5000 mg | ORAL_TABLET | Freq: Two times a day (BID) | ORAL | 1 refills | Status: DC | PRN

## 2021-10-09 NOTE — Progress Notes (Signed)
[facility] ? ?Patient ID: Beverly Rowe,  ?MRN: [id number] ? ?PCP: Pincus Sanes, MD ? ?Date of Evaluation: 10/09/2021 ?Time spent:30 minutes ? ?Chief Complaint:  ?Chief Complaint   ?Anxiety; Depression; Insomnia; Follow-up ?  ? ? ? ?HISTORY/CURRENT STATUS: ?HPI For routine med check. ? ?More anxious, has had a lot of medical bills with her own health from [facility] and an oral surgery, her son had hernia surgery and daughter had wisdom teeth last year. Also trying to decide if/when she should retire. A lot of worries, not sleeping well. Ambien not working well now. Xanax helps with anxiety and sleep. Has been taking the Xanax more often.  ? ?More tired, personal hygiene is a chore on weekends, isolates, not missing work though, cries easily, not sleeping well, ADLs have decreased, chore to clean and go to grocery store. Appetite is nl. All started about 4-5 months ago. No SI/HI. ? ?Patient denies increased energy with decreased need for sleep, no increased talkativeness, no racing thoughts, no impulsivity or risky behaviors, no increased spending, no increased libido, no grandiosity, no increased irritability or anger, and no hallucinations. ? ?Denies dizziness, syncope, seizures, numbness, tingling, tremor, tics, unsteady gait, slurred speech, confusion. Denies muscle or joint pain, stiffness, or dystonia. Denies unexplained weight loss, frequent infections, or sores that heal slowly.  No polyphagia, polydipsia, or polyuria. Denies visual changes or paresthesias.  ? ?Individual Medical History/ Review of Systems: Changes? :No    ? ?Past medications for mental health diagnoses include: ?Xanax, Ambien, Lamictal, Seroquel, Valium, Latuda, Risperdal, Abilify, Depakote, Lexapro ? ?Allergies: Patient has no known allergies. ? ?Current Medications:  ?Current Outpatient Medications:  ?  busPIRone (BUSPAR) 15 MG tablet, Take 1 tablet (15 mg total) by mouth 3 (three) times daily. (Patient taking differently: Take  22.5 mg by mouth 2 (two) times daily.), Disp: 270 tablet, Rfl: 1 ?  famotidine (PEPCID) 10 MG tablet, Take 10 mg by mouth 2 (two) times daily as needed for heartburn or indigestion., Disp: , Rfl:  ?  glucosamine-chondroitin (MAX GLUCOSAMINE CHONDROITIN) 500-400 MG tablet, Take 1 tablet by mouth 3 (three) times daily., Disp: , Rfl:  ?  lamoTRIgine (LAMICTAL) 200 MG tablet, Take 1 tablet (200 mg total) by mouth 2 (two) times daily., Disp: 60 tablet, Rfl: 1 ?  QUEtiapine (SEROQUEL) 300 MG tablet, Take 1 tablet (300 mg total) by mouth at bedtime., Disp: 90 tablet, Rfl: 1 ?  zolpidem (AMBIEN) 10 MG tablet, TAKE 1 TABLET BY MOUTH AT BEDTIME AS NEEDED FOR SLEEP., Disp: 90 tablet, Rfl: 1 ?  [START ON 10/14/2021] ALPRAZolam (XANAX) 1 MG tablet, Take 0.5-1 tablets (0.5-1 mg total) by mouth 2 (two) times daily as needed for anxiety. And add 1/2 pill mid-day if needed., Disp: 225 tablet, Rfl: 1 ?  Rhubarb (ESTROVEN COMPLETE PO), Take by mouth. (Patient not taking: Reported on 01/02/2021), Disp: , Rfl:  ?  vitamin E 180 MG (400 UNITS) capsule, Take 400 Units by mouth daily. (Patient not taking: Reported on 01/02/2021), Disp: , Rfl:  ?Medication Side Effects: none ? ?Family Medical/ Social History: Changes? No. ? ?MENTAL HEALTH EXAM: ? ?There were no vitals taken for this visit.There is no height or weight on file to calculate BMI.  ?General Appearance: Casual, Well Groomed and Obese  ?Eye Contact:  Good  ?Speech:  Clear and Coherent and Normal Rate  ?Volume:  Normal  ?Mood:  Depressed  ?Affect:  Depressed and Anxious  ?Thought Process:  Goal Directed and Descriptions of  Associations: Circumstantial  ?Orientation:  Full (Time, Place, and Person)  ?Thought Content: Logical   ?Suicidal Thoughts:  No  ?Homicidal Thoughts:  No  ?Memory:  Immediate;   Fair ?Recent;   Fair ?Remote;   Good  ?Judgement:  Good  ?Insight:  Good  ?Psychomotor Activity:  Normal  ?Concentration:  Concentration: Good and Attention Span: Good  ?Recall:  Good  ?Fund  of Knowledge: Good  ?Language: Good  ?Assets:  Desire for Improvement  ?ADL's:  Intact  ?Cognition: WNL  ?Prognosis:  Good  ? ? ?DIAGNOSES:  ?  ICD-10-CM   ?1. Major depressive disorder, recurrent episode, moderate (HCC)  F33.1   ?  ?2. Bipolar I disorder (HCC)  F31.9   ?  ?3. Generalized anxiety disorder  F41.1   ?  ?4. Insomnia, unspecified type  G47.00   ?  ? ? ? ? ?Receiving Psychotherapy: No  ? ? ?RECOMMENDATIONS:  ?PDMP was reviewed.  Ambien filled 09/16/2021. Xanax filled 07/23/2021.  ?I provided 30 minutes of face to face time during this encounter, including time spent before and after the visit in records review, medical decision making, counseling pertinent to today's visit, and charting.  ?We discussed her symptoms.  She is more depressed and anxious.  I recommend increasing the Lamictal.  Also increasing the Xanax slightly so that she can take 2.5 pills total per day.  Hopefully once the depression improves we can decrease back down, or may need to increase BuSpar. ? ?Continue Xanax 1 mg, 1/2-1 p.o. twice daily as needed, +1/2 mid day as needed.   ?Continue Buspar 15 mg, 1.5 pills twice daily. ?Increase Lamictal to 200 mg, 1 p.o. twice daily. ?Continue Seroquel 300 mg, 1 p.o. nightly. ?Continue Ambien 10 mg, 1 p.o. nightly as needed sleep.   ?Return in 6 weeks.  ? ?Melony Overly, PA-C  ?

## 2021-10-28 ENCOUNTER — Other Ambulatory Visit: Payer: Self-pay | Admitting: Physician Assistant

## 2021-11-10 ENCOUNTER — Other Ambulatory Visit: Payer: Self-pay | Admitting: Physician Assistant

## 2021-11-22 ENCOUNTER — Ambulatory Visit (INDEPENDENT_AMBULATORY_CARE_PROVIDER_SITE_OTHER): Payer: BC Managed Care – PPO | Admitting: Physician Assistant

## 2021-11-22 ENCOUNTER — Encounter: Payer: Self-pay | Admitting: Physician Assistant

## 2021-11-22 DIAGNOSIS — G4700 Insomnia, unspecified: Secondary | ICD-10-CM

## 2021-11-22 DIAGNOSIS — F411 Generalized anxiety disorder: Secondary | ICD-10-CM | POA: Diagnosis not present

## 2021-11-22 DIAGNOSIS — F319 Bipolar disorder, unspecified: Secondary | ICD-10-CM | POA: Diagnosis not present

## 2021-11-22 MED ORDER — BUSPIRONE HCL 15 MG PO TABS: ORAL_TABLET | ORAL | 1 refills | Status: DC

## 2021-11-22 MED ORDER — QUETIAPINE FUMARATE 300 MG PO TABS: 300.0000 mg | ORAL_TABLET | Freq: Every day | ORAL | 1 refills | Status: DC

## 2021-11-22 MED ORDER — LAMOTRIGINE 200 MG PO TABS: 200.0000 mg | ORAL_TABLET | Freq: Two times a day (BID) | ORAL | 1 refills | Status: DC

## 2021-11-22 MED ORDER — ZOLPIDEM TARTRATE 10 MG PO TABS: 10.0000 mg | ORAL_TABLET | Freq: Every evening | ORAL | 1 refills | Status: DC | PRN

## 2021-11-22 NOTE — Progress Notes (Signed)
[facility] ? ?Patient ID: Beverly Rowe,  ?MRN: [id number] ? ?PCP: Pincus Sanes, MD ? ?Date of Evaluation: 11/22/2021 ?Time spent:20 minutes ? ?Chief Complaint:  ?Chief Complaint   ?Anxiety; Depression; Insomnia; Follow-up ?  ? ? ?HISTORY/CURRENT STATUS: ?HPI For routine med check. ? ?Lamictal was increased about 6 weeks ago.  States she is feeling about 75% better.  More able to enjoy things.  Energy and motivation have increased.  Not isolating.  She has enjoyed being on spring break this week.  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  Appetite is normal and weight is stable.  ADLs and personal hygiene are normal.  Does have anxiety still and usually needs the Xanax twice a day, sometimes the extra 1/2 pill mid day.  No panic attacks but if she does not take the Xanax it can turn into 1.  (I had sent in a prescription for her to be able to take 2.5 pills daily occasionally, but her pharmacy only gave her enough for 2 a day.)  Sleeps well most of the time, but has to use the Ambien.  No suicidal or homicidal thoughts. ? ?Patient denies increased energy with decreased need for sleep, no increased talkativeness, no racing thoughts, no impulsivity or risky behaviors, no increased spending, no increased libido, no grandiosity, no increased irritability or anger, and no hallucinations. ? ?Denies dizziness, syncope, seizures, numbness, tingling, tremor, tics, unsteady gait, slurred speech, confusion. Denies muscle or joint pain, stiffness, or dystonia. Denies unexplained weight loss, frequent infections, or sores that heal slowly.  No polyphagia, polydipsia, or polyuria. Denies visual changes or paresthesias.  ? ?Individual Medical History/ Review of Systems: Changes? :No    ? ?Past medications for mental health diagnoses include: ?Xanax, Ambien, Lamictal, Seroquel, Valium, Latuda, Risperdal, Abilify, Depakote, Lexapro ? ?Allergies: Patient has no known allergies. ? ?Current Medications:   ?Current Outpatient Medications:  ?  ALPRAZolam (XANAX) 1 MG tablet, Take 0.5-1 tablets (0.5-1 mg total) by mouth 2 (two) times daily as needed for anxiety. And add 1/2 pill mid-day if needed., Disp: 225 tablet, Rfl: 1 ?  famotidine (PEPCID) 10 MG tablet, Take 10 mg by mouth 2 (two) times daily as needed for heartburn or indigestion., Disp: , Rfl:  ?  glucosamine-chondroitin (MAX GLUCOSAMINE CHONDROITIN) 500-400 MG tablet, Take 1 tablet by mouth 3 (three) times daily., Disp: , Rfl:  ?  busPIRone (BUSPAR) 15 MG tablet, 2 po q am, 1 po qhs, Disp: 270 tablet, Rfl: 1 ?  lamoTRIgine (LAMICTAL) 200 MG tablet, Take 1 tablet (200 mg total) by mouth 2 (two) times daily., Disp: 180 tablet, Rfl: 1 ?  QUEtiapine (SEROQUEL) 300 MG tablet, Take 1 tablet (300 mg total) by mouth at bedtime., Disp: 90 tablet, Rfl: 1 ?  Rhubarb (ESTROVEN COMPLETE PO), Take by mouth. (Patient not taking: Reported on 01/02/2021), Disp: , Rfl:  ?  vitamin E 180 MG (400 UNITS) capsule, Take 400 Units by mouth daily. (Patient not taking: Reported on 01/02/2021), Disp: , Rfl:  ?  zolpidem (AMBIEN) 10 MG tablet, Take 1 tablet (10 mg total) by mouth at bedtime as needed., Disp: 90 tablet, Rfl: 1 ?Medication Side Effects: none ? ?Family Medical/ Social History: Changes? No. ? ?MENTAL HEALTH EXAM: ? ?There were no vitals taken for this visit.There is no height or weight on file to calculate BMI.  ?General Appearance: Casual, Well Groomed and Obese  ?Eye Contact:  Good  ?Speech:  Clear and Coherent and Normal  Rate  ?Volume:  Normal  ?Mood:  Euthymic  ?Affect:  Congruent  ?Thought Process:  Goal Directed and Descriptions of Associations: Circumstantial  ?Orientation:  Full (Time, Place, and Person)  ?Thought Content: Logical   ?Suicidal Thoughts:  No  ?Homicidal Thoughts:  No  ?Memory:  Immediate;   Fair ?Recent;   Fair ?Remote;   Good  ?Judgement:  Good  ?Insight:  Good  ?Psychomotor Activity:  Normal  ?Concentration:  Concentration: Good and Attention Span:  Good  ?Recall:  Good  ?Fund of Knowledge: Good  ?Language: Good  ?Assets:  Desire for Improvement  ?ADL's:  Intact  ?Cognition: WNL  ?Prognosis:  Good  ? ? ?DIAGNOSES:  ?  ICD-10-CM   ?1. Bipolar I disorder (HCC)  F31.9   ?  ?2. Generalized anxiety disorder  F41.1   ?  ?3. Insomnia, unspecified type  G47.00   ?  ? ? ?Receiving Psychotherapy: No  ? ? ?RECOMMENDATIONS:  ?PDMP was reviewed.  Ambien filled 09/16/2021. Xanax filled 10/20/2021. ?I provided 20 minutes of face to face time during this encounter, including time spent before and after the visit in records review, medical decision making, counseling pertinent to today's visit, and charting.  ?She is doing well so no changes will be made. ?Of importance is that her pharmacy filled only 180 pills 3/12 when it should have 225 as per 10/14/2021.  She may need an earlier refill which is expected. ? ?Continue Xanax 1 mg, 1/2-1 p.o. twice daily as needed, +1/2 mid day as needed.   ?Continue Buspar 15 mg, 2 p.o. every morning 1 p.o. nightly ?Continue Lamictal  200 mg, 1 p.o. twice daily. ?Continue Seroquel 300 mg, 1 p.o. nightly. ?Continue Ambien 10 mg, 1 p.o. nightly as needed sleep.   ?Return in 6 months. ? ?Melony Overly, PA-C  ?

## 2022-04-23 ENCOUNTER — Other Ambulatory Visit: Payer: Self-pay | Admitting: Physician Assistant

## 2022-07-04 ENCOUNTER — Other Ambulatory Visit: Payer: Self-pay | Admitting: Physician Assistant

## 2022-07-08 NOTE — Telephone Encounter (Signed)
Please schedule appt

## 2022-07-10 ENCOUNTER — Other Ambulatory Visit: Payer: Self-pay | Admitting: Physician Assistant

## 2022-07-10 NOTE — Telephone Encounter (Signed)
Beverly Rowe called this morning at 9:30 checking on her appt.  She didn't have one so she is now scheduled for 12/5.  She did say she needs refill of her Lamictal and Xanax before the appt.  Others medications can wait.  Send to [facility] on [address]

## 2022-07-15 ENCOUNTER — Ambulatory Visit: Payer: BC Managed Care – PPO | Admitting: Physician Assistant

## 2022-07-15 ENCOUNTER — Encounter: Payer: Self-pay | Admitting: Physician Assistant

## 2022-07-15 DIAGNOSIS — G4700 Insomnia, unspecified: Secondary | ICD-10-CM | POA: Diagnosis not present

## 2022-07-15 DIAGNOSIS — F319 Bipolar disorder, unspecified: Secondary | ICD-10-CM | POA: Diagnosis not present

## 2022-07-15 DIAGNOSIS — Z79899 Other long term (current) drug therapy: Secondary | ICD-10-CM | POA: Diagnosis not present

## 2022-07-15 DIAGNOSIS — F411 Generalized anxiety disorder: Secondary | ICD-10-CM

## 2022-07-15 MED ORDER — ZOLPIDEM TARTRATE 10 MG PO TABS: 10.0000 mg | ORAL_TABLET | Freq: Every evening | ORAL | 1 refills | Status: DC | PRN

## 2022-07-15 MED ORDER — ALPRAZOLAM 1 MG PO TABS: ORAL_TABLET | ORAL | 1 refills | Status: DC

## 2022-07-15 MED ORDER — QUETIAPINE FUMARATE 300 MG PO TABS: 300.0000 mg | ORAL_TABLET | Freq: Every day | ORAL | 1 refills | Status: DC

## 2022-07-15 MED ORDER — LAMOTRIGINE 200 MG PO TABS: 200.0000 mg | ORAL_TABLET | Freq: Two times a day (BID) | ORAL | 1 refills | Status: DC

## 2022-07-15 MED ORDER — BUSPIRONE HCL 30 MG PO TABS: 30.0000 mg | ORAL_TABLET | Freq: Two times a day (BID) | ORAL | 1 refills | Status: DC

## 2022-07-15 NOTE — Progress Notes (Signed)
[facility]  Patient ID: Beverly Rowe,  MRN: [id number]  PCP: Pincus Sanes, MD  Date of Evaluation: 07/15/2022 Time spent:30 minutes  Chief Complaint:  Chief Complaint   Anxiety; Depression; Insomnia; Follow-up    HISTORY/CURRENT STATUS: HPI For routine med check.  Patient is able to enjoy things.  Energy and motivation are good.  Work is going well but is stressful.  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  That has not the only thing though, she just feels overwhelmed a lot, has a sense of uneasiness like something bad is going to happen.  Xanax is helpful but she is having to take it more often.  No extreme sadness, tearfulness, or feelings of hopelessness.  Sleeps well most of the time. Does need the Ambien every few nights. ADLs and personal hygiene are normal.   Denies any changes in concentration, making decisions, or remembering things.  Appetite has not changed.  Weight is stable.  Denies suicidal or homicidal thoughts.  Patient denies increased energy with decreased need for sleep, increased talkativeness, racing thoughts, impulsivity or risky behaviors, increased spending, increased libido, grandiosity, increased irritability or anger, paranoia, or hallucinations.  Denies dizziness, syncope, seizures, numbness, tingling, tremor, tics, unsteady gait, slurred speech, confusion. Denies muscle or joint pain, stiffness, or dystonia. Denies unexplained weight loss, frequent infections, or sores that heal slowly.  No polyphagia, polydipsia, or polyuria. Denies visual changes or paresthesias.   Individual Medical History/ Review of Systems: Changes? :No     Past medications for mental health diagnoses include: Xanax, Ambien, Lamictal, Seroquel, Valium, Latuda, Risperdal, Abilify, Depakote, Lexapro  Allergies: Patient has no known allergies.  Current Medications:  Current Outpatient Medications:    busPIRone (BUSPAR) 30 MG tablet, Take  1 tablet (30 mg total) by mouth 2 (two) times daily., Disp: 180 tablet, Rfl: 1   famotidine (PEPCID) 10 MG tablet, Take 10 mg by mouth 2 (two) times daily as needed for heartburn or indigestion., Disp: , Rfl:    glucosamine-chondroitin (MAX GLUCOSAMINE CHONDROITIN) 500-400 MG tablet, Take 1 tablet by mouth 3 (three) times daily., Disp: , Rfl:    ALPRAZolam (XANAX) 1 MG tablet, TAKE 0.5-1 TABLETS BY MOUTH 2 (TWO) TIMES DAILY AS NEEDED FOR ANXIETY. AND ADD 1/2 PILL MID-DAY IF NEEDED (OKAY TO FILL ON 10/14/21), Disp: 225 tablet, Rfl: 1   lamoTRIgine (LAMICTAL) 200 MG tablet, Take 1 tablet (200 mg total) by mouth 2 (two) times daily., Disp: 180 tablet, Rfl: 1   QUEtiapine (SEROQUEL) 300 MG tablet, Take 1 tablet (300 mg total) by mouth at bedtime., Disp: 90 tablet, Rfl: 1   Rhubarb (ESTROVEN COMPLETE PO), Take by mouth. (Patient not taking: Reported on 01/02/2021), Disp: , Rfl:    vitamin E 180 MG (400 UNITS) capsule, Take 400 Units by mouth daily. (Patient not taking: Reported on 01/02/2021), Disp: , Rfl:    zolpidem (AMBIEN) 10 MG tablet, Take 1 tablet (10 mg total) by mouth at bedtime as needed., Disp: 90 tablet, Rfl: 1 Medication Side Effects: none  Family Medical/ Social History: Changes? No.  MENTAL HEALTH EXAM:  There were no vitals taken for this visit.There is no height or weight on file to calculate BMI.  General Appearance: Casual, Well Groomed and Obese  Eye Contact:  Good  Speech:  Clear and Coherent and Normal Rate  Volume:  Normal  Mood:  Euthymic  Affect:  Congruent  Thought Process:  Goal Directed and Descriptions of  Associations: Circumstantial  Orientation:  Full (Time, Place, and Person)  Thought Content: Logical   Suicidal Thoughts:  No  Homicidal Thoughts:  No  Memory:  Immediate;   Fair Recent;   Fair Remote;   Good  Judgement:  Good  Insight:  Good  Psychomotor Activity:  Normal  Concentration:  Concentration: Good and Attention Span: Good  Recall:  Good  Fund of  Knowledge: Good  Language: Good  Assets:  Desire for Improvement  ADL's:  Intact  Cognition: WNL  Prognosis:  Good    DIAGNOSES:    ICD-10-CM   1. Bipolar I disorder (HCC)  F31.9 Lipid panel    Hemoglobin A1c    Comprehensive metabolic panel    2. Generalized anxiety disorder  F41.1     3. Insomnia, unspecified type  G47.00     4. Encounter for long-term (current) use of medications  Z79.899 Lipid panel    Hemoglobin A1c    Comprehensive metabolic panel     Receiving Psychotherapy: No   RECOMMENDATIONS:  PDMP was reviewed.  Ambien filled 09/16/2021. Xanax filled 10/20/2021. I provided 30 minutes of face to face time during this encounter, including time spent before and after the visit in records review, medical decision making, counseling pertinent to today's visit, and charting.   Discussed to the anxiety.  Recommend increasing BuSpar dose.  She would like to try it.  Continue Xanax 1 mg, 1/2-1 p.o. twice daily as needed, +1/2 mid day as needed.   Increase BuSpar to 30 mg, 1 p.o. twice daily. Continue Lamictal  200 mg, 1 p.o. twice daily. Continue Seroquel 300 mg, 1 p.o. nightly. Continue Ambien 10 mg, 1 p.o. nightly as needed sleep.   Labs ordered as above.  Stressed importance due to the possible metabolic effects from Seroquel.  Need to be done fasting. Return in 6 weeks.  Melony Overly, PA-C

## 2022-08-29 ENCOUNTER — Ambulatory Visit: Payer: BC Managed Care – PPO | Admitting: Physician Assistant

## 2022-10-13 ENCOUNTER — Encounter: Payer: Self-pay | Admitting: Physician Assistant

## 2022-10-13 ENCOUNTER — Telehealth (INDEPENDENT_AMBULATORY_CARE_PROVIDER_SITE_OTHER): Payer: BC Managed Care – PPO | Admitting: Physician Assistant

## 2022-10-13 DIAGNOSIS — F319 Bipolar disorder, unspecified: Secondary | ICD-10-CM

## 2022-10-13 DIAGNOSIS — F411 Generalized anxiety disorder: Secondary | ICD-10-CM

## 2022-10-13 DIAGNOSIS — G4700 Insomnia, unspecified: Secondary | ICD-10-CM | POA: Diagnosis not present

## 2022-10-13 NOTE — Progress Notes (Signed)
[facility]  Patient ID: PACE PUMA,  MRN: [id number]  PCP: Binnie Rail, MD  Date of Evaluation: 10/13/2022 Time spent:20 minutes  Chief Complaint:  Chief Complaint   Anxiety; Depression; Insomnia; Follow-up   Virtual Visit via Telehealth  I connected with patient by a video enabled telemedicine application with their informed consent, and verified patient privacy and that I am speaking with the correct person using two identifiers.  I am private, in my office and the patient is at home.  I discussed the limitations, risks, security and privacy concerns of performing an evaluation and management service by video and the availability of in person appointments. I also discussed with the patient that there may be a patient responsible charge related to this service. The patient expressed understanding and agreed to proceed.   I discussed the assessment and treatment plan with the patient. The patient was provided an opportunity to ask questions and all were answered. The patient agreed with the plan and demonstrated an understanding of the instructions.   The patient was advised to call back or seek an in-person evaluation if the symptoms worsen or if the condition fails to improve as anticipated.  I provided 20  minutes of non-face-to-face time during this encounter.  HISTORY/CURRENT STATUS: HPI For routine med check.  3 months ago BuSpar was increased.  That has been very helpful for the anxiety.  Not having panic attacks like she had been.  Feels less anxious in general as well.  She still needs the Xanax fairly often, it is helpful.  Patient is able to enjoy things.  Energy and motivation are good.  Work is going well.   No extreme sadness, tearfulness, or feelings of hopelessness.  Sleeps well most of the time.  Does need the Ambien almost every night.  Hard time going to sleep if she does not take it.  ADLs and personal hygiene are normal.   Denies any changes in  concentration, making decisions, or remembering things.  Appetite has not changed.  Weight is stable.   Denies suicidal or homicidal thoughts.  Patient denies increased energy with decreased need for sleep, increased talkativeness, racing thoughts, impulsivity or risky behaviors, increased spending, increased libido, grandiosity, increased irritability or anger, paranoia, or hallucinations.  Denies dizziness, syncope, seizures, numbness, tingling, tremor, tics, unsteady gait, slurred speech, confusion. Denies muscle or joint pain, stiffness, or dystonia. Denies unexplained weight loss, frequent infections, or sores that heal slowly.  No polyphagia, polydipsia, or polyuria. Denies visual changes or paresthesias.   Individual Medical History/ Review of Systems: Changes? :Yes  flu    Past medications for mental health diagnoses include: Xanax, Ambien, Lamictal, Seroquel, Valium, Latuda, Risperdal, Abilify, Depakote, Lexapro  Allergies: Patient has no known allergies.  Current Medications:  Current Outpatient Medications:    ALPRAZolam (XANAX) 1 MG tablet, TAKE 0.5-1 TABLETS BY MOUTH 2 (TWO) TIMES DAILY AS NEEDED FOR ANXIETY. AND ADD 1/2 PILL MID-DAY IF NEEDED (OKAY TO FILL ON 10/14/21), Disp: 225 tablet, Rfl: 1   busPIRone (BUSPAR) 30 MG tablet, Take 1 tablet (30 mg total) by mouth 2 (two) times daily., Disp: 180 tablet, Rfl: 1   famotidine (PEPCID) 10 MG tablet, Take 10 mg by mouth 2 (two) times daily as needed for heartburn or indigestion., Disp: , Rfl:    glucosamine-chondroitin (MAX GLUCOSAMINE CHONDROITIN) 500-400 MG tablet, Take 1 tablet by mouth 3 (three) times daily., Disp: , Rfl:    lamoTRIgine (LAMICTAL) 200 MG tablet, Take 1 tablet (  200 mg total) by mouth 2 (two) times daily., Disp: 180 tablet, Rfl: 1   QUEtiapine (SEROQUEL) 300 MG tablet, Take 1 tablet (300 mg total) by mouth at bedtime., Disp: 90 tablet, Rfl: 1   zolpidem (AMBIEN) 10 MG tablet, Take 1 tablet (10 mg total) by mouth at  bedtime as needed., Disp: 90 tablet, Rfl: 1   Rhubarb (ESTROVEN COMPLETE PO), Take by mouth. (Patient not taking: Reported on 01/02/2021), Disp: , Rfl:    vitamin E 180 MG (400 UNITS) capsule, Take 400 Units by mouth daily. (Patient not taking: Reported on 01/02/2021), Disp: , Rfl:  Medication Side Effects: none  Family Medical/ Social History: Changes? No.  MENTAL HEALTH EXAM:  There were no vitals taken for this visit.There is no height or weight on file to calculate BMI.  General Appearance: Casual, Well Groomed and Obese  Eye Contact:  Good  Speech:  Clear and Coherent and Normal Rate  Volume:  Normal  Mood:  Euthymic  Affect:  Congruent  Thought Process:  Goal Directed and Descriptions of Associations: Circumstantial  Orientation:  Full (Time, Place, and Person)  Thought Content: Logical   Suicidal Thoughts:  No  Homicidal Thoughts:  No  Memory:  Immediate;   Fair Recent;   Fair Remote;   Good  Judgement:  Good  Insight:  Good  Psychomotor Activity:  Normal  Concentration:  Concentration: Good and Attention Span: Good  Recall:  Good  Fund of Knowledge: Good  Language: Good  Assets:  Desire for Improvement Financial Resources/Insurance Housing Transportation Vocational/Educational  ADL's:  Intact  Cognition: WNL  Prognosis:  Good   DIAGNOSES:    ICD-10-CM   1. Generalized anxiety disorder  F41.1     2. Bipolar I disorder ([place])  F31.9     3. Insomnia, unspecified type  G47.00       Receiving Psychotherapy: No   RECOMMENDATIONS:  PDMP was reviewed.  Ambien filled 07/19/2022.. Xanax filled 07/21/2022. I provided 20 minutes of non-face-to-face time during this encounter, including time spent before and after the visit in records review, medical decision making, counseling pertinent to today's visit, and charting.   She is doing well with current mental health medications so no changes will be made.  Continue Xanax 1 mg, 1/2-1 p.o. twice daily as needed, +1/2 mid  day as needed.   Continue BuSpar  30 mg, 1 p.o. twice daily. Continue Lamictal  200 mg, 1 p.o. twice daily. Continue Seroquel 300 mg, 1 p.o. nightly. Continue Ambien 10 mg, 1 p.o. nightly as needed sleep.   Stressed importance of having labs drawn due to the possible metabolic effects from Seroquel.  She understands and accepts those risks.  Need to be done fasting.   Return in 6 months.  Donnal Moat, PA-C

## 2023-01-13 ENCOUNTER — Other Ambulatory Visit: Payer: Self-pay | Admitting: Physician Assistant

## 2023-01-14 NOTE — Telephone Encounter (Signed)
Due June 9

## 2023-01-15 NOTE — Telephone Encounter (Signed)
Pt is out of her medication

## 2023-01-17 ENCOUNTER — Other Ambulatory Visit: Payer: Self-pay | Admitting: Physician Assistant

## 2023-02-04 ENCOUNTER — Other Ambulatory Visit: Payer: Self-pay | Admitting: Physician Assistant

## 2023-02-10 ENCOUNTER — Other Ambulatory Visit: Payer: Self-pay | Admitting: Physician Assistant

## 2023-04-14 ENCOUNTER — Encounter: Payer: Self-pay | Admitting: Physician Assistant

## 2023-04-14 ENCOUNTER — Ambulatory Visit (INDEPENDENT_AMBULATORY_CARE_PROVIDER_SITE_OTHER): Payer: BC Managed Care – PPO | Admitting: Physician Assistant

## 2023-04-14 DIAGNOSIS — R5383 Other fatigue: Secondary | ICD-10-CM

## 2023-04-14 DIAGNOSIS — Z79899 Other long term (current) drug therapy: Secondary | ICD-10-CM

## 2023-04-14 DIAGNOSIS — F411 Generalized anxiety disorder: Secondary | ICD-10-CM

## 2023-04-14 DIAGNOSIS — F319 Bipolar disorder, unspecified: Secondary | ICD-10-CM

## 2023-04-14 DIAGNOSIS — R5381 Other malaise: Secondary | ICD-10-CM | POA: Diagnosis not present

## 2023-04-14 MED ORDER — ESCITALOPRAM OXALATE 5 MG PO TABS
5.0000 mg | ORAL_TABLET | Freq: Every day | ORAL | 1 refills | Status: DC
Start: 2023-04-14 — End: 2023-06-18

## 2023-04-14 NOTE — Progress Notes (Unsigned)
[facility]  Patient ID: Beverly Rowe,  MRN: [id number]  PCP: Pincus Sanes, MD  Date of Evaluation: 04/14/2023 Time spent:30 minutes  Chief Complaint:  Chief Complaint   Depression; Anxiety    HISTORY/CURRENT STATUS: HPI For routine med check.  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 Too stressful. Cries easily, 2-3 days per week she stays in bed all day. It's a chore to take a shower and do laundry. "I shouldn't feel like this." Asks if she should get disability.  She's keeping her 69 month old granddaughter 2 days per week.  Doesn't want to do anything, energy and motivation are low.  Sleeps ok.  Denies any changes in concentration, making decisions, or remembering things.  Appetite has not changed.  Weight is stable.  States she is anxious all the time.  Not having panic attacks but just feels overwhelmed in general.  Feels like the anxiety affects her stomach and has had more trouble with IBS.  Denies suicidal or homicidal thoughts.  Patient denies increased energy with decreased need for sleep, increased talkativeness, racing thoughts, impulsivity or risky behaviors, increased spending, increased libido, grandiosity, increased irritability or anger, paranoia, or hallucinations.  Review of Systems  Constitutional:  Positive for malaise/fatigue.  HENT: Negative.    Eyes: Negative.   Respiratory: Negative.    Cardiovascular: Negative.   Gastrointestinal: Negative.   Genitourinary: Negative.   Musculoskeletal: Negative.   Skin: Negative.   Neurological: Negative.   Endo/Heme/Allergies: Negative.   Psychiatric/Behavioral:         See HPI.   Individual Medical History/ Review of Systems: Changes? :Yes see HPI.  Past medications for mental health diagnoses include: Xanax, Ambien, Lamictal, Seroquel, Valium, Latuda, Risperdal, Abilify, Depakote, Lexapro, Wellbutrin "I didn't feel right"  Allergies: Patient has no known allergies.  Current Medications:   Current Outpatient Medications:    ALPRAZolam (XANAX) 1 MG tablet, TAKE 0.5-1 TABLETS BY MOUTH 2 TIMES DAILY AS NEEDED FOR ANXIETY. AND ADD 1/2 PILL MID-DAY IF NEEDED, Disp: 225 tablet, Rfl: 2   busPIRone (BUSPAR) 30 MG tablet, TAKE 1 TABLET BY MOUTH 2 TIMES DAILY., Disp: 180 tablet, Rfl: 1   escitalopram (LEXAPRO) 5 MG tablet, Take 1 tablet (5 mg total) by mouth daily., Disp: 30 tablet, Rfl: 1   famotidine (PEPCID) 10 MG tablet, Take 10 mg by mouth 2 (two) times daily as needed for heartburn or indigestion., Disp: , Rfl:    glucosamine-chondroitin (MAX GLUCOSAMINE CHONDROITIN) 500-400 MG tablet, Take 1 tablet by mouth 3 (three) times daily., Disp: , Rfl:    lamoTRIgine (LAMICTAL) 200 MG tablet, TAKE 1 TABLET BY MOUTH TWICE A DAY, Disp: 180 tablet, Rfl: 0   QUEtiapine (SEROQUEL) 300 MG tablet, TAKE 1 TABLET BY MOUTH EVERYDAY AT BEDTIME, Disp: 90 tablet, Rfl: 1   zolpidem (AMBIEN) 10 MG tablet, TAKE 1 TABLET BY MOUTH AT BEDTIME AS NEEDED., Disp: 90 tablet, Rfl: 0   Rhubarb (ESTROVEN COMPLETE PO), Take by mouth. (Patient not taking: Reported on 01/02/2021), Disp: , Rfl:    vitamin E 180 MG (400 UNITS) capsule, Take 400 Units by mouth daily. (Patient not taking: Reported on 01/02/2021), Disp: , Rfl:  Medication Side Effects: none  Family Medical/ Social History: Changes? No.  MENTAL HEALTH EXAM:  There were no vitals taken for this visit.There is no height or weight on file to calculate BMI.  General Appearance: Casual and Well Groomed  Eye Contact:  Good  Speech:  Clear and Coherent and  Normal Rate  Volume:  Normal  Mood:  Euthymic  Affect:  Congruent  Thought Process:  Goal Directed and Descriptions of Associations: Circumstantial  Orientation:  Full (Time, Place, and Person)  Thought Content: Logical   Suicidal Thoughts:  No  Homicidal Thoughts:  No  Memory:  Immediate;   Fair Recent;   Fair Remote;   Good  Judgement:  Good  Insight:  Good  Psychomotor Activity:  Normal   Concentration:  Concentration: Good and Attention Span: Good  Recall:  Good  Fund of Knowledge: Good  Language: Good  Assets:  Desire for Improvement Financial Resources/Insurance Housing Transportation Vocational/Educational  ADL's:  Intact  Cognition: WNL  Prognosis:  Good   DIAGNOSES:    ICD-10-CM   1. Bipolar I disorder (HCC)  F31.9 Hemoglobin A1c    Comprehensive metabolic panel    Lipid panel    CBC with Differential/Platelet    TSH    VITAMIN D 25 Hydroxy (Vit-D Deficiency, Fractures)    2. Encounter for long-term (current) use of medications  Z79.899 Hemoglobin A1c    Comprehensive metabolic panel    Lipid panel    CBC with Differential/Platelet    TSH    VITAMIN D 25 Hydroxy (Vit-D Deficiency, Fractures)    3. Malaise and fatigue  R53.81 Hemoglobin A1c   R53.83 Comprehensive metabolic panel    CBC with Differential/Platelet    TSH    VITAMIN D 25 Hydroxy (Vit-D Deficiency, Fractures)    4. Generalized anxiety disorder  F41.1       Receiving Psychotherapy: No   RECOMMENDATIONS:  PDMP was reviewed.  Ambien filled 02/13/2023.  Xanax filled 01/18/2023. I provided 30  minutes of face to face time during this encounter, including time spent before and after the visit in records review, medical decision making, counseling pertinent to today's visit, and charting.   We discussed her symptoms.  I recommend adding an antidepressant.  That will help with the anxiety as well as depression.  Benefits, risk and side effects were discussed and she accepts.  We discussed the possibility of disability.  Anyone can apply for disability, it is unpredictable as to whether she will be given that or not.  At this time, I cannot say whether or not her mental health will be such to prevent her from working for the rest of her life.  My goal is to get her feeling better so she can go on with her every day life and enjoy it.  Continue Xanax 1 mg, 1/2-1 p.o. twice daily as needed, +1/2  mid day as needed.   Continue BuSpar  30 mg, 1 p.o. twice daily. Start Lexapro 5 mg, 1 p.o. daily. Continue Lamictal  200 mg, 1 p.o. twice daily. Continue Seroquel 300 mg, 1 p.o. nightly. Continue Ambien 10 mg, 1 p.o. nightly as needed sleep.   Stressed importance of having labs drawn due to the possible metabolic effects from Seroquel.  She understands and accepts those risks.  Need to be done fasting.   Return in 6 weeks.   Melony Overly, PA-C

## 2023-04-18 ENCOUNTER — Other Ambulatory Visit: Payer: Self-pay | Admitting: Physician Assistant

## 2023-05-10 ENCOUNTER — Other Ambulatory Visit: Payer: Self-pay | Admitting: Physician Assistant

## 2023-05-13 ENCOUNTER — Other Ambulatory Visit: Payer: Self-pay | Admitting: Physician Assistant

## 2023-05-14 NOTE — Telephone Encounter (Signed)
Lv 04/14/23; LF 02/05/23 3 MONTH SPPLY; NV 05/28/23. RF Appropriate ( unless dt upcoming appt. We need to only order 1 month?)

## 2023-05-28 ENCOUNTER — Ambulatory Visit: Payer: BC Managed Care – PPO | Admitting: Physician Assistant

## 2023-06-18 ENCOUNTER — Other Ambulatory Visit: Payer: Self-pay | Admitting: Physician Assistant

## 2023-06-18 NOTE — Telephone Encounter (Signed)
LF 10/4 AND 10/3; Requested pt to be scheduled and changed rf to 0.

## 2023-06-18 NOTE — Telephone Encounter (Signed)
Noted  

## 2023-06-18 NOTE — Telephone Encounter (Signed)
Please schedule pt appt.  

## 2023-06-18 NOTE — Telephone Encounter (Signed)
I'm not sure we can tell when a non-controlled substance is filled, but only when the Rx was last prescribed. So on non-controlled meds, you don't have to put dates on them. And if you don't mind, put a note saying 'needs to be seen for more' or something like that, so we'll know she's been told she has to be seen. I don't know of any other way to know that otherwise. Even though you've asked the admin staff to make an appt, sometimes the pt doesn't keep the appt. Thank you!

## 2023-06-18 NOTE — Telephone Encounter (Signed)
[facility]- no voice mail

## 2023-07-21 ENCOUNTER — Other Ambulatory Visit: Payer: Self-pay | Admitting: Physician Assistant

## 2023-07-21 NOTE — Telephone Encounter (Signed)
 Please call to schedule FU, was a no show last appt.

## 2023-07-21 NOTE — Telephone Encounter (Signed)
Pt is scheduled for 09/03/23

## 2023-07-23 ENCOUNTER — Other Ambulatory Visit: Payer: Self-pay | Admitting: Physician Assistant

## 2023-08-26 ENCOUNTER — Other Ambulatory Visit: Payer: Self-pay | Admitting: Physician Assistant

## 2023-09-03 ENCOUNTER — Ambulatory Visit: Payer: 59 | Admitting: Physician Assistant

## 2023-09-26 ENCOUNTER — Other Ambulatory Visit: Payer: Self-pay | Admitting: Physician Assistant

## 2023-10-03 ENCOUNTER — Other Ambulatory Visit: Payer: Self-pay | Admitting: Physician Assistant

## 2023-10-19 ENCOUNTER — Encounter: Payer: Self-pay | Admitting: Physician Assistant

## 2023-10-19 ENCOUNTER — Ambulatory Visit (INDEPENDENT_AMBULATORY_CARE_PROVIDER_SITE_OTHER): Payer: 59 | Admitting: Physician Assistant

## 2023-10-19 DIAGNOSIS — F411 Generalized anxiety disorder: Secondary | ICD-10-CM

## 2023-10-19 DIAGNOSIS — R5381 Other malaise: Secondary | ICD-10-CM

## 2023-10-19 DIAGNOSIS — R5383 Other fatigue: Secondary | ICD-10-CM

## 2023-10-19 DIAGNOSIS — G4700 Insomnia, unspecified: Secondary | ICD-10-CM | POA: Diagnosis not present

## 2023-10-19 DIAGNOSIS — F319 Bipolar disorder, unspecified: Secondary | ICD-10-CM

## 2023-10-19 MED ORDER — LAMOTRIGINE 200 MG PO TABS
200.0000 mg | ORAL_TABLET | Freq: Two times a day (BID) | ORAL | 1 refills | Status: DC
Start: 2023-10-19 — End: 2024-04-09

## 2023-10-19 MED ORDER — ALPRAZOLAM 1 MG PO TABS: ORAL_TABLET | ORAL | 1 refills | Status: DC

## 2023-10-19 MED ORDER — ESCITALOPRAM OXALATE 5 MG PO TABS: 5.0000 mg | ORAL_TABLET | Freq: Every day | ORAL | 1 refills | Status: DC

## 2023-10-19 MED ORDER — QUETIAPINE FUMARATE 300 MG PO TABS: 300.0000 mg | ORAL_TABLET | Freq: Every day | ORAL | 1 refills | Status: DC

## 2023-10-19 MED ORDER — ZOLPIDEM TARTRATE 10 MG PO TABS: 10.0000 mg | ORAL_TABLET | Freq: Every evening | ORAL | 1 refills | Status: DC | PRN

## 2023-10-19 MED ORDER — BUSPIRONE HCL 30 MG PO TABS: 30.0000 mg | ORAL_TABLET | Freq: Two times a day (BID) | ORAL | 1 refills | Status: DC

## 2023-10-19 NOTE — Progress Notes (Signed)
 [facility]  Patient ID: Beverly Rowe,  MRN: [id number]  PCP: Pincus Sanes, MD  Date of Evaluation: 10/19/2023 Time spent:35 minutes  Chief Complaint:  Chief Complaint   Anxiety; Depression; Insomnia; Follow-up    HISTORY/CURRENT STATUS: HPI 4 months overdue for routine med check.  Marcelino Duster has officially retired.  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 She will be a year old in May. Marcelino Duster feels like her medications are working well.  She still has anxiety even though she is not working.  She takes Xanax and it is effective.  She feels panicky if she does not take it.  Most of the time though she gets overwhelmed, feeling like something bad may happen at any time.  She has trouble going to sleep and staying asleep if she does not take the Ambien.  Patient is able to enjoy things.  Energy and motivation are good.  No extreme sadness, tearfulness, or feelings of hopelessness.  ADLs and personal hygiene are normal.   Denies any changes in concentration, making decisions, or remembering things.  Appetite has not changed.  Weight is stable.  Denies suicidal or homicidal thoughts.  Patient denies increased energy with decreased need for sleep, increased talkativeness, racing thoughts, impulsivity or risky behaviors, increased spending, increased libido, grandiosity, increased irritability or anger, paranoia, or hallucinations.  Denies dizziness, syncope, seizures, numbness, tingling, tremor, tics, unsteady gait, slurred speech, confusion. Denies muscle or joint pain, stiffness, or dystonia. Denies unexplained weight loss, frequent infections, or sores that heal slowly.  No polyphagia, polydipsia, or polyuria. Denies visual changes or paresthesias.   Individual Medical History/ Review of Systems: Changes? :No    Past medications for mental health diagnoses include: Xanax, Ambien, Lamictal, Seroquel, Valium, Latuda, Risperdal,  Abilify, Depakote, Lexapro, Wellbutrin "I didn't feel right"  Allergies: Patient has no known allergies.  Current Medications:  Current Outpatient Medications:    famotidine (PEPCID) 10 MG tablet, Take 10 mg by mouth 2 (two) times daily as needed for heartburn or indigestion., Disp: , Rfl:    glucosamine-chondroitin (MAX GLUCOSAMINE CHONDROITIN) 500-400 MG tablet, Take 1 tablet by mouth 3 (three) times daily., Disp: , Rfl:    ALPRAZolam (XANAX) 1 MG tablet, TAKE 0.5-1 TABLETS BY MOUTH 2 TIMES DAILY AS NEEDED FOR ANXIETY. AND ADD 1/2 PILL MID-DAY IF NEEDED, Disp: 225 tablet, Rfl: 1   busPIRone (BUSPAR) 30 MG tablet, Take 1 tablet (30 mg total) by mouth 2 (two) times daily., Disp: 180 tablet, Rfl: 1   escitalopram (LEXAPRO) 5 MG tablet, Take 1 tablet (5 mg total) by mouth daily., Disp: 90 tablet, Rfl: 1   lamoTRIgine (LAMICTAL) 200 MG tablet, Take 1 tablet (200 mg total) by mouth 2 (two) times daily., Disp: 180 tablet, Rfl: 1   QUEtiapine (SEROQUEL) 300 MG tablet, Take 1 tablet (300 mg total) by mouth at bedtime., Disp: 90 tablet, Rfl: 1   Rhubarb (ESTROVEN COMPLETE PO), Take by mouth. (Patient not taking: Reported on 10/19/2023), Disp: , Rfl:    vitamin E 180 MG (400 UNITS) capsule, Take 400 Units by mouth daily. (Patient not taking: Reported on 10/19/2023), Disp: , Rfl:    zolpidem (AMBIEN) 10 MG tablet, Take 1 tablet (10 mg total) by mouth at bedtime as needed., Disp: 90 tablet, Rfl: 1 Medication Side Effects: none  Family Medical/ Social History: Changes? No.  MENTAL HEALTH EXAM:  There were no vitals taken for this visit.There is no height or  weight on file to calculate BMI.  General Appearance: Casual and Well Groomed  Eye Contact:  Good  Speech:  Clear and Coherent and Normal Rate  Volume:  Normal  Mood:  Euthymic  Affect:  Congruent  Thought Process:  Goal Directed and Descriptions of Associations: Circumstantial  Orientation:  Full (Time, Place, and Person)  Thought Content:  Logical   Suicidal Thoughts:  No  Homicidal Thoughts:  No  Memory:  Immediate;   Fair Recent;   Fair Remote;   Good  Judgement:  Good  Insight:  Good  Psychomotor Activity:  Normal  Concentration:  Concentration: Good and Attention Span: Good  Recall:  Good  Fund of Knowledge: Good  Language: Good  Assets:  Communication Skills Desire for Improvement Financial Resources/Insurance Housing Resilience Transportation  ADL's:  Intact  Cognition: WNL  Prognosis:  Good   DIAGNOSES:    ICD-10-CM   1. Bipolar I disorder (HCC)  F31.9     2. Generalized anxiety disorder  F41.1     3. Insomnia, unspecified type  G47.00     4. Malaise and fatigue  R53.81    R53.83       Receiving Psychotherapy: No   RECOMMENDATIONS:  PDMP was reviewed.  Ambien filled 02/13/2023.  Xanax filled 07/23/2023. I provided 35 minutes of face to face time during this encounter, including time spent before and after the visit in records review, medical decision making, counseling pertinent to today's visit, and charting.   She is doing well as far as her medications go so no changes will be made.  Continue Xanax 1 mg, 1/2-1 p.o. twice daily as needed, +1/2 mid day as needed.   Continue BuSpar  30 mg, 1 p.o. twice daily. Continue Lexapro 5 mg, 1 p.o. daily. Continue Lamictal  200 mg, 1 p.o. twice daily. Continue Seroquel 300 mg, 1 p.o. nightly. Continue Ambien 10 mg, 1 p.o. nightly as needed sleep.   Stressed importance of having labs drawn due to the possible metabolic effects from Seroquel including diabetes and hyperlipidemia.  Discussed the possible complications from those illnesses and the fact that if they are uncovered sooner rather than later the treatment is easier to deal with.  She understands and accepts those risks.  Return in 6 months.   Melony Overly, PA-C

## 2024-04-08 ENCOUNTER — Other Ambulatory Visit: Payer: Self-pay | Admitting: Physician Assistant

## 2024-04-14 ENCOUNTER — Other Ambulatory Visit: Payer: Self-pay | Admitting: Physician Assistant

## 2024-04-21 ENCOUNTER — Encounter: Payer: Self-pay | Admitting: Physician Assistant

## 2024-04-21 ENCOUNTER — Other Ambulatory Visit: Payer: Self-pay | Admitting: Physician Assistant

## 2024-04-21 ENCOUNTER — Ambulatory Visit: Admitting: Physician Assistant

## 2024-04-21 DIAGNOSIS — G4700 Insomnia, unspecified: Secondary | ICD-10-CM | POA: Diagnosis not present

## 2024-04-21 DIAGNOSIS — F319 Bipolar disorder, unspecified: Secondary | ICD-10-CM

## 2024-04-21 DIAGNOSIS — F411 Generalized anxiety disorder: Secondary | ICD-10-CM

## 2024-04-21 MED ORDER — QUETIAPINE FUMARATE 300 MG PO TABS: 300.0000 mg | ORAL_TABLET | Freq: Every day | ORAL | 1 refills | Status: AC

## 2024-04-21 MED ORDER — ALPRAZOLAM 1 MG PO TABS: ORAL_TABLET | ORAL | 1 refills | Status: AC

## 2024-04-21 MED ORDER — ZOLPIDEM TARTRATE 10 MG PO TABS: 10.0000 mg | ORAL_TABLET | Freq: Every evening | ORAL | 1 refills | Status: AC | PRN

## 2024-04-21 MED ORDER — ESCITALOPRAM OXALATE 10 MG PO TABS: 10.0000 mg | ORAL_TABLET | Freq: Every day | ORAL | 1 refills | Status: DC

## 2024-04-21 MED ORDER — BUSPIRONE HCL 30 MG PO TABS: 30.0000 mg | ORAL_TABLET | Freq: Two times a day (BID) | ORAL | 1 refills | Status: AC

## 2024-04-21 NOTE — Progress Notes (Signed)
 [facility]  Patient ID: Beverly Rowe,  MRN: [id number]  PCP: Geofm Glade PARAS, MD  Date of Evaluation: 04/21/2024 Time spent:20 minutes  Chief Complaint:  Chief Complaint   Anxiety; Depression; Insomnia; Follow-up    HISTORY/CURRENT STATUS: HPI For routine med check.   Filed for disability.  Feels bad all the time. Hurts all over most all the time. Stays in bed around 3 days per week.  Appetite is decreased.  No change in weight except maybe 5 # gain. ADLs are limited to the necessary things.  Personal hygiene is nl.  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  Feels sad. No feelings of hopelessness.  Sleeps ok.   Denies any changes in concentration, making decisions, or remembering things.  Appetite has not changed.  Weight is stable. Anxiety is controlled.  No mania, delirium, AH/VH.  No SI/HI.  Individual Medical History/ Review of Systems: Changes? :No    Past medications for mental health diagnoses include: Xanax , Ambien , Lamictal , Seroquel , Valium, Latuda, Risperdal, Abilify, Depakote, Lexapro , Wellbutrin I didn't feel right  Allergies: Patient has no known allergies.  Current Medications:  Current Outpatient Medications:    escitalopram  (LEXAPRO ) 10 MG tablet, Take 1 tablet (10 mg total) by mouth daily., Disp: 90 tablet, Rfl: 1   famotidine (PEPCID) 10 MG tablet, Take 10 mg by mouth 2 (two) times daily as needed for heartburn or indigestion., Disp: , Rfl:    glucosamine-chondroitin (MAX GLUCOSAMINE CHONDROITIN) 500-400 MG tablet, Take 1 tablet by mouth 3 (three) times daily., Disp: , Rfl:    lamoTRIgine  (LAMICTAL ) 200 MG tablet, TAKE 1 TABLET BY MOUTH TWICE A DAY, Disp: 180 tablet, Rfl: 1   Rhubarb (ESTROVEN COMPLETE PO), Take by mouth., Disp: , Rfl:    VITAMIN D, CHOLECALCIFEROL, PO, Take by mouth., Disp: , Rfl:    vitamin E 180 MG (400 UNITS) capsule, Take 400 Units by mouth daily., Disp: , Rfl:    ALPRAZolam  (XANAX ) 1 MG tablet, TAKE 0.5-1  TABLETS BY MOUTH 2 TIMES DAILY AS NEEDED FOR ANXIETY. AND ADD 1/2 PILL MID-DAY IF NEEDED, Disp: 225 tablet, Rfl: 1   busPIRone  (BUSPAR ) 30 MG tablet, Take 1 tablet (30 mg total) by mouth 2 (two) times daily., Disp: 180 tablet, Rfl: 1   QUEtiapine  (SEROQUEL ) 300 MG tablet, Take 1 tablet (300 mg total) by mouth at bedtime., Disp: 90 tablet, Rfl: 1   zolpidem  (AMBIEN ) 10 MG tablet, Take 1 tablet (10 mg total) by mouth at bedtime as needed., Disp: 90 tablet, Rfl: 1 Medication Side Effects: none  Family Medical/ Social History: Changes? No.  MENTAL HEALTH EXAM:  There were no vitals taken for this visit.There is no height or weight on file to calculate BMI.  General Appearance: Casual and Well Groomed  Eye Contact:  Good  Speech:  Clear and Coherent and Normal Rate  Volume:  Normal  Mood:  Euthymic  Affect:  Congruent  Thought Process:  Goal Directed and Descriptions of Associations: Circumstantial  Orientation:  Full (Time, Place, and Person)  Thought Content: Logical   Suicidal Thoughts:  No  Homicidal Thoughts:  No  Memory:  Immediate;   Fair Recent;   Fair Remote;   Good  Judgement:  Good  Insight:  Good  Psychomotor Activity:  Normal  Concentration:  Concentration: Good and Attention Span: Good  Recall:  Good  Fund of Knowledge: Good  Language: Good  Assets:  Communication Skills Desire for Improvement Financial Resources/Insurance Housing Resilience  Transportation  ADL's:  Intact  Cognition: WNL  Prognosis:  Good   PCP follows labs  DIAGNOSES:    ICD-10-CM   1. Generalized anxiety disorder  F41.1     2. Insomnia, unspecified type  G47.00     3. Bipolar depression (HCC)  F31.9       Receiving Psychotherapy: No   RECOMMENDATIONS:  PDMP was reviewed.  Ambien  filled 03/16/2023.  Xanax  filled 01/18/2024. I provided approximately 20 minutes of face to face time during this encounter, including time spent before and after the visit in records review, medical decision  making, counseling pertinent to today's visit, and charting.   Discussed options for uncontrolled depression. Recommend increasing Lexapro .  Continue Xanax  1 mg, 1/2-1 p.o. twice daily as needed, +1/2 mid day as needed.   Continue BuSpar   30 mg, 1 p.o. twice daily. Increase Lexapro  to 10 mg, 1 p.o. daily. Continue Lamictal   200 mg, 1 p.o. twice daily. Continue Seroquel  300 mg, 1 p.o. nightly. Continue Ambien  10 mg, 1 p.o. nightly as needed sleep.   Return in 2 months.   Verneita Cooks, PA-C

## 2024-06-23 ENCOUNTER — Ambulatory Visit: Admitting: Physician Assistant

## 2024-07-22 ENCOUNTER — Telehealth: Admitting: Physician Assistant

## 2024-07-22 ENCOUNTER — Encounter: Payer: Self-pay | Admitting: Physician Assistant

## 2024-07-22 DIAGNOSIS — R5381 Other malaise: Secondary | ICD-10-CM | POA: Diagnosis not present

## 2024-07-22 DIAGNOSIS — F411 Generalized anxiety disorder: Secondary | ICD-10-CM | POA: Diagnosis not present

## 2024-07-22 DIAGNOSIS — G4700 Insomnia, unspecified: Secondary | ICD-10-CM | POA: Diagnosis not present

## 2024-07-22 DIAGNOSIS — F319 Bipolar disorder, unspecified: Secondary | ICD-10-CM | POA: Diagnosis not present

## 2024-07-22 DIAGNOSIS — R5383 Other fatigue: Secondary | ICD-10-CM | POA: Diagnosis not present

## 2024-07-22 MED ORDER — ESCITALOPRAM OXALATE 5 MG PO TABS: 15.0000 mg | ORAL_TABLET | Freq: Every day | ORAL | 1 refills | Status: AC

## 2024-07-22 NOTE — Progress Notes (Signed)
 [facility]  Patient ID: Beverly Rowe,  MRN: [id number]  PCP: Geofm Glade PARAS, MD  Date of Evaluation: 07/22/2024 Time spent:20 minutes  Chief Complaint:  Chief Complaint   Anxiety; Depression; Insomnia; Follow-up   Virtual Visit via Telehealth  I connected with patient by a video enabled telemedicine application with their informed consent, and verified patient privacy and that I am speaking with the correct person using two identifiers.  I am private, in my office and the patient is at home.  I discussed the limitations, risks, security and privacy concerns of performing an evaluation and management service by video and the availability of in person appointments. I also discussed with the patient that there may be a patient responsible charge related to this service. The patient expressed understanding and agreed to proceed.   I discussed the assessment and treatment plan with the patient. The patient was provided an opportunity to ask questions and all were answered. The patient agreed with the plan and demonstrated an understanding of the instructions.   The patient was advised to call back or seek an in-person evaluation if the symptoms worsen or if the condition fails to improve as anticipated.  I provided approximately 20  minutes of non-face-to-face time during this encounter.  HISTORY/CURRENT STATUS: HPI For routine med check.   Still w/ some sx of depression.  Anhedonia.  Energy and motivation are fair.  Maybe a little better since we increased the Lexapro  but still not where she wants to be.  She is not working due to physical and mental health reasons.  She keeps her granddaughter a few times a week if she is able.  No feelings of hopelessness.  Sleeps well with the Ambien .  ADLs and personal hygiene are normal.   Denies any changes in concentration, making decisions, or remembering things.  Appetite has not changed.  Weight is stable.  Anxiety is controlled.   The Xanax  is helpful.  No mania, delirium, AH/VH.  No SI/HI.  Individual Medical History/ Review of Systems: Changes? :No    Past medications for mental health diagnoses include: Xanax , Ambien , Lamictal , Seroquel , Valium, Latuda, Risperdal, Abilify, Depakote, Lexapro , Wellbutrin I didn't feel right  Allergies: Patient has no known allergies.  Current Medications:  Current Outpatient Medications:    ALPRAZolam  (XANAX ) 1 MG tablet, TAKE 0.5-1 TABLETS BY MOUTH 2 TIMES DAILY AS NEEDED FOR ANXIETY. AND ADD 1/2 PILL MID-DAY IF NEEDED, Disp: 225 tablet, Rfl: 1   busPIRone  (BUSPAR ) 30 MG tablet, Take 1 tablet (30 mg total) by mouth 2 (two) times daily., Disp: 180 tablet, Rfl: 1   escitalopram  (LEXAPRO ) 5 MG tablet, Take 3 tablets (15 mg total) by mouth daily., Disp: 270 tablet, Rfl: 1   famotidine (PEPCID) 10 MG tablet, Take 10 mg by mouth 2 (two) times daily as needed for heartburn or indigestion., Disp: , Rfl:    glucosamine-chondroitin (MAX GLUCOSAMINE CHONDROITIN) 500-400 MG tablet, Take 1 tablet by mouth 3 (three) times daily., Disp: , Rfl:    lamoTRIgine  (LAMICTAL ) 200 MG tablet, TAKE 1 TABLET BY MOUTH TWICE A DAY, Disp: 180 tablet, Rfl: 1   QUEtiapine  (SEROQUEL ) 300 MG tablet, Take 1 tablet (300 mg total) by mouth at bedtime., Disp: 90 tablet, Rfl: 1   Rhubarb (ESTROVEN COMPLETE PO), Take by mouth., Disp: , Rfl:    VITAMIN D, CHOLECALCIFEROL, PO, Take by mouth., Disp: , Rfl:    vitamin E 180 MG (400 UNITS) capsule, Take 400 Units by mouth daily., Disp: ,  Rfl:    zolpidem  (AMBIEN ) 10 MG tablet, Take 1 tablet (10 mg total) by mouth at bedtime as needed., Disp: 90 tablet, Rfl: 1 Medication Side Effects: none  Family Medical/ Social History: Changes? No.  MENTAL HEALTH EXAM:  There were no vitals taken for this visit.There is no height or weight on file to calculate BMI.  General Appearance: Casual and Well Groomed  Eye Contact:  Good  Speech:  Clear and Coherent and Normal Rate  Volume:   Normal  Mood:  Euthymic  Affect:  Congruent  Thought Process:  Goal Directed and Descriptions of Associations: Circumstantial  Orientation:  Full (Time, Place, and Person)  Thought Content: Logical   Suicidal Thoughts:  No  Homicidal Thoughts:  No  Memory:  Immediate;   Fair Recent;   Fair Remote;   Good  Judgement:  Good  Insight:  Good  Psychomotor Activity:  Normal  Concentration:  Concentration: Good and Attention Span: Good  Recall:  Good  Fund of Knowledge: Good  Language: Good  Assets:  Communication Skills Desire for Improvement Financial Resources/Insurance Housing Leisure Time Resilience Transportation  ADL's:  Intact  Cognition: WNL  Prognosis:  Good   PCP follows labs  DIAGNOSES:    ICD-10-CM   1. Bipolar depression (HCC)  F31.9     2. Malaise and fatigue  R53.81    R53.83     3. Generalized anxiety disorder  F41.1     4. Insomnia, unspecified type  G47.00        Receiving Psychotherapy: No   RECOMMENDATIONS:  PDMP was reviewed.  Ambien  filled 06/27/2024.  Xanax  filled 04/21/2024. I provided approximately 20 minutes of non-face to face time during this encounter, including time spent before and after the visit in records review, medical decision making, counseling pertinent to today's visit, and charting.   Her mood has improved and is more stable since we increased the Lexapro  at the last visit.  I think she could feel even better though and recommend increasing the dose.  She agrees.  Continue Xanax  1 mg, 1/2-1 p.o. twice daily as needed, +1/2 mid day as needed.   Continue BuSpar   30 mg, 1 p.o. twice daily. Increase Lexapro  to 5 mg, 3 po every day.  Continue Lamictal   200 mg, 1 p.o. bid. Continue Seroquel  300 mg, 1 p.o. nightly. Continue Ambien  10 mg, 1 p.o. nightly as needed sleep.   Return in 6 to 8 weeks.  Verneita Cooks, PA-C

## 2024-08-02 ENCOUNTER — Ambulatory Visit: Admitting: Physician Assistant

## 2024-09-02 ENCOUNTER — Encounter: Payer: Self-pay | Admitting: Physician Assistant

## 2024-09-02 ENCOUNTER — Telehealth: Admitting: Physician Assistant

## 2024-09-02 DIAGNOSIS — F319 Bipolar disorder, unspecified: Secondary | ICD-10-CM

## 2024-09-02 DIAGNOSIS — R5381 Other malaise: Secondary | ICD-10-CM

## 2024-09-02 DIAGNOSIS — R5383 Other fatigue: Secondary | ICD-10-CM | POA: Diagnosis not present

## 2024-09-02 DIAGNOSIS — G4700 Insomnia, unspecified: Secondary | ICD-10-CM

## 2024-09-02 DIAGNOSIS — F411 Generalized anxiety disorder: Secondary | ICD-10-CM | POA: Diagnosis not present

## 2024-09-02 MED ORDER — LAMOTRIGINE 200 MG PO TABS: 200.0000 mg | ORAL_TABLET | Freq: Two times a day (BID) | ORAL | 1 refills | Status: AC

## 2024-09-02 NOTE — Progress Notes (Signed)
 "     [facility]  Patient ID: Beverly Rowe,  MRN: [id number]  PCP: Geofm Glade PARAS, MD  Date of Evaluation: 09/02/2024 Time spent:25 minutes  Chief Complaint:  Chief Complaint   Depression; Anxiety; Insomnia; Follow-up    Virtual Visit via Telehealth  I connected with patient by a video enabled telemedicine application with their informed consent, and verified patient privacy and that I am speaking with the correct person using two identifiers.  I am private, in my office and the patient is at home.  I discussed the limitations, risks, security and privacy concerns of performing an evaluation and management service by video and the availability of in person appointments. I also discussed with the patient that there may be a patient responsible charge related to this service. The patient expressed understanding and agreed to proceed.   I discussed the assessment and treatment plan with the patient. The patient was provided an opportunity to ask questions and all were answered. The patient agreed with the plan and demonstrated an understanding of the instructions.   The patient was advised to call back or seek an in-person evaluation if the symptoms worsen or if the condition fails to improve as anticipated.  I provided approximately 25  minutes of non-face-to-face time during this encounter.  HISTORY/CURRENT STATUS: HPI For routine med check.   Doing better since we increased the Lexapro  at the LOV.  Anxiety is controlled when she's at home. Xanax  helps.  It is really hard for her to go out to the grocery store or anywhere due to the anxiety.  She does not have panic attacks often but mostly feels overwhelmed.  And it can progress to panicky feelings if she does not take the Xanax .  Patient is able to enjoy things.  Energy and motivation are good.  She keeps her grandkids a few times a week for a few hours.  No extreme sadness, tearfulness, or feelings of hopelessness.   Sleeps okay with the Ambien . ADLs and personal hygiene are normal.   Denies any changes in concentration, making decisions, or remembering things.  Appetite has not changed.  Weight is stable.  No mania, delirium, AH/VH.  No SI/HI.  Individual Medical History/ Review of Systems: Changes? :No    Past medications for mental health diagnoses include: Xanax , Ambien , Lamictal , Seroquel , Valium, Latuda, Risperdal, Abilify, Depakote, Lexapro , Wellbutrin I didn't feel right  Allergies: Patient has no known allergies.  Current Medications:  Current Outpatient Medications:    ALPRAZolam  (XANAX ) 1 MG tablet, TAKE 0.5-1 TABLETS BY MOUTH 2 TIMES DAILY AS NEEDED FOR ANXIETY. AND ADD 1/2 PILL MID-DAY IF NEEDED, Disp: 225 tablet, Rfl: 1   busPIRone  (BUSPAR ) 30 MG tablet, Take 1 tablet (30 mg total) by mouth 2 (two) times daily., Disp: 180 tablet, Rfl: 1   escitalopram  (LEXAPRO ) 5 MG tablet, Take 3 tablets (15 mg total) by mouth daily., Disp: 270 tablet, Rfl: 1   famotidine (PEPCID) 10 MG tablet, Take 10 mg by mouth 2 (two) times daily as needed for heartburn or indigestion., Disp: , Rfl:    glucosamine-chondroitin (MAX GLUCOSAMINE CHONDROITIN) 500-400 MG tablet, Take 1 tablet by mouth 3 (three) times daily., Disp: , Rfl:    QUEtiapine  (SEROQUEL ) 300 MG tablet, Take 1 tablet (300 mg total) by mouth at bedtime., Disp: 90 tablet, Rfl: 1   Rhubarb (ESTROVEN COMPLETE PO), Take by mouth., Disp: , Rfl:    VITAMIN D, CHOLECALCIFEROL, PO, Take by mouth., Disp: , Rfl:  vitamin E 180 MG (400 UNITS) capsule, Take 400 Units by mouth daily., Disp: , Rfl:    zolpidem  (AMBIEN ) 10 MG tablet, Take 1 tablet (10 mg total) by mouth at bedtime as needed., Disp: 90 tablet, Rfl: 1   lamoTRIgine  (LAMICTAL ) 200 MG tablet, Take 1 tablet (200 mg total) by mouth 2 (two) times daily., Disp: 180 tablet, Rfl: 1 Medication Side Effects: none  Family Medical/ Social History: Changes? No.  MENTAL HEALTH EXAM:  There were no vitals  taken for this visit.There is no height or weight on file to calculate BMI.  General Appearance: Casual and Well Groomed  Eye Contact:  Good  Speech:  Clear and Coherent and Normal Rate  Volume:  Normal  Mood:  Euthymic  Affect:  Congruent  Thought Process:  Goal Directed and Descriptions of Associations: Circumstantial  Orientation:  Full (Time, Place, and Person)  Thought Content: Logical   Suicidal Thoughts:  No  Homicidal Thoughts:  No  Memory:  Immediate;   Fair Recent;   Fair Remote;   Good  Judgement:  Good  Insight:  Good  Psychomotor Activity:  Normal  Concentration:  Concentration: Good and Attention Span: Good  Recall:  Good  Fund of Knowledge: Good  Language: Good  Assets:  Desire for Improvement Financial Resources/Insurance Housing Leisure Time Resilience Transportation  ADL's:  Intact  Cognition: WNL  Prognosis:  Good   PCP follows labs  DIAGNOSES:    ICD-10-CM   1. Bipolar depression (HCC)  F31.9     2. Generalized anxiety disorder  F41.1     3. Insomnia, unspecified type  G47.00     4. Malaise and fatigue  R53.81    R53.83       Receiving Psychotherapy: No   RECOMMENDATIONS:  PDMP was reviewed.  Ambien  filled 06/27/2024.  Xanax  filled 04/21/2024. I provided approximately 20 minutes of non-face-to-face time during this encounter, including time spent before and after the visit in records review, medical decision making, counseling pertinent to today's visit, and charting.   She is feeling better since the increase of Lexapro  at the last visit.  Continue all current treatment.  She had labs drawn this morning while at an appointment with her PCP.  She will have her PCP send them to me..  Continue Xanax  1 mg, 1/2-1 p.o. twice daily as needed, +1/2 mid day as needed.   Continue BuSpar   30 mg, 1 p.o. twice daily. Continue  Lexapro   5 mg, 3 po every day.  Continue Lamictal   200 mg, 1 p.o. bid. Continue Seroquel  300 mg, 1 p.o. nightly. Continue  Ambien  10 mg, 1 p.o. nightly as needed sleep.   Return in 3 months.   Verneita Cooks, PA-C  "

## 2024-12-01 ENCOUNTER — Telehealth: Admitting: Physician Assistant
